# Patient Record
Sex: Male | Born: 1935 | Race: White | Hispanic: No | Marital: Married | Smoking: Never smoker
Health system: Southern US, Community
[De-identification: ages and names within clinical notes are randomized; demographics above are authoritative.]

## PROBLEM LIST (undated history)

## (undated) DIAGNOSIS — E119 Type 2 diabetes mellitus without complications: Secondary | ICD-10-CM

## (undated) DIAGNOSIS — N289 Disorder of kidney and ureter, unspecified: Secondary | ICD-10-CM

## (undated) DIAGNOSIS — M109 Gout, unspecified: Secondary | ICD-10-CM

## (undated) DIAGNOSIS — I10 Essential (primary) hypertension: Secondary | ICD-10-CM

## (undated) DIAGNOSIS — I509 Heart failure, unspecified: Secondary | ICD-10-CM

---

## 2003-09-02 ENCOUNTER — Ambulatory Visit (HOSPITAL_COMMUNITY): Admission: RE | Admit: 2003-09-02 | Discharge: 2003-09-02 | Payer: Self-pay | Admitting: Cardiology

## 2003-09-02 ENCOUNTER — Encounter (INDEPENDENT_AMBULATORY_CARE_PROVIDER_SITE_OTHER): Payer: Self-pay | Admitting: Cardiology

## 2008-09-01 ENCOUNTER — Encounter: Admission: RE | Admit: 2008-09-01 | Discharge: 2008-09-01 | Payer: Self-pay | Admitting: Neurological Surgery

## 2010-07-20 ENCOUNTER — Ambulatory Visit
Admission: RE | Admit: 2010-07-20 | Discharge: 2010-07-20 | Disposition: A | Discharge status: Home / Self Care | Payer: MEDICARE | Source: Ambulatory Visit | Attending: Cardiology | Admitting: Cardiology

## 2010-07-20 ENCOUNTER — Other Ambulatory Visit: Payer: Self-pay | Admitting: Cardiology

## 2010-07-20 DIAGNOSIS — R0989 Other specified symptoms and signs involving the circulatory and respiratory systems: Secondary | ICD-10-CM

## 2010-07-20 DIAGNOSIS — R0609 Other forms of dyspnea: Secondary | ICD-10-CM

## 2010-07-22 ENCOUNTER — Ambulatory Visit (HOSPITAL_COMMUNITY)
Admission: RE | Admit: 2010-07-22 | Discharge: 2010-07-22 | Disposition: A | Discharge status: Home / Self Care | Payer: MEDICARE | Source: Ambulatory Visit | Attending: Cardiology | Admitting: Cardiology

## 2010-07-22 DIAGNOSIS — R0989 Other specified symptoms and signs involving the circulatory and respiratory systems: Secondary | ICD-10-CM | POA: Insufficient documentation

## 2010-07-22 DIAGNOSIS — R0609 Other forms of dyspnea: Secondary | ICD-10-CM | POA: Insufficient documentation

## 2010-07-22 DIAGNOSIS — I059 Rheumatic mitral valve disease, unspecified: Secondary | ICD-10-CM | POA: Insufficient documentation

## 2010-07-22 DIAGNOSIS — I509 Heart failure, unspecified: Secondary | ICD-10-CM | POA: Insufficient documentation

## 2011-07-23 ENCOUNTER — Other Ambulatory Visit: Payer: Self-pay | Admitting: Cardiology

## 2011-08-13 ENCOUNTER — Other Ambulatory Visit: Payer: Self-pay | Admitting: Cardiology

## 2012-08-01 ENCOUNTER — Telehealth: Payer: Self-pay | Admitting: Oncology

## 2012-08-01 NOTE — Telephone Encounter (Signed)
Called pt to to schedule NP appt. NO abel to leave vm due to mailbox is full will try back.

## 2012-08-06 ENCOUNTER — Telehealth: Payer: Self-pay | Admitting: Oncology

## 2012-08-06 NOTE — Telephone Encounter (Signed)
C/D 08/06/12 for appt.08/24/12 °

## 2012-08-06 NOTE — Telephone Encounter (Signed)
S/W pt in re NP appt 4/4 @ 1:30 w/Dr. Clelia Croft.  Referring Dr. Windle Guard Dx- ? Anemia of Chronic Disease, CKD.  Welcome packet mailed.

## 2012-08-09 ENCOUNTER — Encounter (HOSPITAL_COMMUNITY): Payer: Self-pay | Admitting: Emergency Medicine

## 2012-08-09 ENCOUNTER — Emergency Department (HOSPITAL_COMMUNITY): Payer: Medicare Other

## 2012-08-09 ENCOUNTER — Inpatient Hospital Stay (HOSPITAL_COMMUNITY)
Admission: EM | Admit: 2012-08-09 | Discharge: 2012-08-12 | DRG: 553 | Disposition: A | Discharge status: Home / Self Care | Payer: Medicare Other | Attending: Internal Medicine | Admitting: Internal Medicine

## 2012-08-09 DIAGNOSIS — I10 Essential (primary) hypertension: Secondary | ICD-10-CM

## 2012-08-09 DIAGNOSIS — I509 Heart failure, unspecified: Secondary | ICD-10-CM

## 2012-08-09 DIAGNOSIS — N289 Disorder of kidney and ureter, unspecified: Secondary | ICD-10-CM

## 2012-08-09 DIAGNOSIS — G9349 Other encephalopathy: Secondary | ICD-10-CM | POA: Diagnosis present

## 2012-08-09 DIAGNOSIS — N182 Chronic kidney disease, stage 2 (mild): Secondary | ICD-10-CM | POA: Diagnosis present

## 2012-08-09 DIAGNOSIS — G9340 Encephalopathy, unspecified: Secondary | ICD-10-CM

## 2012-08-09 DIAGNOSIS — R509 Fever, unspecified: Secondary | ICD-10-CM

## 2012-08-09 DIAGNOSIS — R6510 Systemic inflammatory response syndrome (SIRS) of non-infectious origin without acute organ dysfunction: Secondary | ICD-10-CM

## 2012-08-09 DIAGNOSIS — N189 Chronic kidney disease, unspecified: Secondary | ICD-10-CM

## 2012-08-09 DIAGNOSIS — E119 Type 2 diabetes mellitus without complications: Secondary | ICD-10-CM

## 2012-08-09 DIAGNOSIS — D638 Anemia in other chronic diseases classified elsewhere: Secondary | ICD-10-CM

## 2012-08-09 DIAGNOSIS — M25539 Pain in unspecified wrist: Secondary | ICD-10-CM | POA: Diagnosis present

## 2012-08-09 DIAGNOSIS — M109 Gout, unspecified: Principal | ICD-10-CM

## 2012-08-09 DIAGNOSIS — D631 Anemia in chronic kidney disease: Secondary | ICD-10-CM | POA: Diagnosis present

## 2012-08-09 DIAGNOSIS — N179 Acute kidney failure, unspecified: Secondary | ICD-10-CM

## 2012-08-09 DIAGNOSIS — I129 Hypertensive chronic kidney disease with stage 1 through stage 4 chronic kidney disease, or unspecified chronic kidney disease: Secondary | ICD-10-CM | POA: Diagnosis present

## 2012-08-09 DIAGNOSIS — I959 Hypotension, unspecified: Secondary | ICD-10-CM

## 2012-08-09 DIAGNOSIS — Z79899 Other long term (current) drug therapy: Secondary | ICD-10-CM

## 2012-08-09 HISTORY — DX: Gout, unspecified: M10.9

## 2012-08-09 HISTORY — DX: Heart failure, unspecified: I50.9

## 2012-08-09 HISTORY — DX: Disorder of kidney and ureter, unspecified: N28.9

## 2012-08-09 HISTORY — DX: Type 2 diabetes mellitus without complications: E11.9

## 2012-08-09 HISTORY — DX: Essential (primary) hypertension: I10

## 2012-08-09 LAB — URINALYSIS, ROUTINE W REFLEX MICROSCOPIC
Bilirubin Urine: NEGATIVE
Glucose, UA: NEGATIVE mg/dL
Ketones, ur: NEGATIVE mg/dL
Leukocytes, UA: NEGATIVE
Nitrite: NEGATIVE
Protein, ur: 30 mg/dL — AB
Specific Gravity, Urine: 1.018 (ref 1.005–1.030)
Urobilinogen, UA: 1 mg/dL (ref 0.0–1.0)
pH: 7.5 (ref 5.0–8.0)

## 2012-08-09 LAB — SEDIMENTATION RATE: Sed Rate: 63 mm/hr — ABNORMAL HIGH (ref 0–16)

## 2012-08-09 LAB — CBC WITH DIFFERENTIAL/PLATELET
Basophils Absolute: 0 10*3/uL (ref 0.0–0.1)
Basophils Relative: 0 % (ref 0–1)
Eosinophils Absolute: 0 10*3/uL (ref 0.0–0.7)
Eosinophils Relative: 0 % (ref 0–5)
HCT: 31.3 % — ABNORMAL LOW (ref 39.0–52.0)
Hemoglobin: 10 g/dL — ABNORMAL LOW (ref 13.0–17.0)
Lymphocytes Relative: 5 % — ABNORMAL LOW (ref 12–46)
Lymphs Abs: 0.9 10*3/uL (ref 0.7–4.0)
MCH: 29.7 pg (ref 26.0–34.0)
MCHC: 31.9 g/dL (ref 30.0–36.0)
MCV: 92.9 fL (ref 78.0–100.0)
Monocytes Absolute: 1.1 10*3/uL — ABNORMAL HIGH (ref 0.1–1.0)
Monocytes Relative: 7 % (ref 3–12)
Neutro Abs: 14.5 10*3/uL — ABNORMAL HIGH (ref 1.7–7.7)
Neutrophils Relative %: 88 % — ABNORMAL HIGH (ref 43–77)
Platelets: 205 10*3/uL (ref 150–400)
RBC: 3.37 MIL/uL — ABNORMAL LOW (ref 4.22–5.81)
RDW: 14.2 % (ref 11.5–15.5)
WBC: 16.5 10*3/uL — ABNORMAL HIGH (ref 4.0–10.5)

## 2012-08-09 LAB — CG4 I-STAT (LACTIC ACID): Lactic Acid, Venous: 2.09 mmol/L (ref 0.5–2.2)

## 2012-08-09 LAB — BASIC METABOLIC PANEL
BUN: 32 mg/dL — ABNORMAL HIGH (ref 6–23)
CO2: 25 mEq/L (ref 19–32)
Calcium: 9 mg/dL (ref 8.4–10.5)
Chloride: 97 mEq/L (ref 96–112)
Creatinine, Ser: 1.76 mg/dL — ABNORMAL HIGH (ref 0.50–1.35)
GFR calc Af Amer: 41 mL/min — ABNORMAL LOW (ref >90)
GFR calc non Af Amer: 36 mL/min — ABNORMAL LOW (ref >90)
Glucose, Bld: 212 mg/dL — ABNORMAL HIGH (ref 70–99)
Potassium: 3.5 mEq/L (ref 3.5–5.1)
Sodium: 135 mEq/L (ref 135–145)

## 2012-08-09 LAB — URINE MICROSCOPIC-ADD ON

## 2012-08-09 LAB — URIC ACID: Uric Acid, Serum: 8 mg/dL — ABNORMAL HIGH (ref 4.0–7.8)

## 2012-08-09 MED ORDER — ACETAMINOPHEN 325 MG PO TABS
650.0000 mg | ORAL_TABLET | Freq: Once | ORAL | Status: AC
  Administered 2012-08-09: 650 mg via ORAL
  Filled 2012-08-09: qty 2

## 2012-08-09 MED ORDER — FENTANYL CITRATE 0.05 MG/ML IJ SOLN
50.0000 ug | Freq: Once | INTRAMUSCULAR | Status: AC
  Administered 2012-08-09: 50 ug via INTRAVENOUS
  Filled 2012-08-09: qty 2

## 2012-08-09 MED ORDER — LIDOCAINE HCL 2 % IJ SOLN
INTRAMUSCULAR | Status: AC
  Administered 2012-08-10
  Filled 2012-08-09: qty 20

## 2012-08-09 MED ORDER — SODIUM CHLORIDE 0.9 % IV BOLUS (SEPSIS)
1000.0000 mL | Freq: Once | INTRAVENOUS | Status: AC
  Administered 2012-08-09: 1000 mL via INTRAVENOUS

## 2012-08-09 MED ORDER — DEXTROSE 5 % IV SOLN
2.0000 g | Freq: Once | INTRAVENOUS | Status: AC
  Administered 2012-08-09: 2 mg via INTRAVENOUS
  Filled 2012-08-09: qty 2

## 2012-08-09 MED ORDER — VANCOMYCIN HCL IN DEXTROSE 1-5 GM/200ML-% IV SOLN
1000.0000 mg | Freq: Once | INTRAVENOUS | Status: AC
  Administered 2012-08-09: 1000 mg via INTRAVENOUS
  Filled 2012-08-09: qty 200

## 2012-08-09 MED ORDER — SODIUM CHLORIDE 0.9 % IV BOLUS (SEPSIS)
2000.0000 mL | Freq: Once | INTRAVENOUS | Status: AC
  Administered 2012-08-09: 2000 mL via INTRAVENOUS

## 2012-08-09 MED ORDER — MORPHINE SULFATE 4 MG/ML IJ SOLN
4.0000 mg | Freq: Once | INTRAMUSCULAR | Status: AC
  Administered 2012-08-09: 4 mg via INTRAVENOUS
  Filled 2012-08-09: qty 1

## 2012-08-09 NOTE — Consult Note (Signed)
Darren Brennan is an 79 y.o. male.   Chief Complaint: left wrist pain HPI: 79 yo rhd male present in ED with wife.  Being admitted for fever.  Had fever to 104.  Now normalized.  Fell ~ 3 weeks ago and has had wrist pain since fall.  History of gout in knees but not in wrist previously.  On Allopurinol.  Wrist feels better now than it did earlier.  Has noted warm feeling in wrist.  Past Medical History  Diagnosis Date  . Hypertension   . Diabetes mellitus without complication   . Gout   . Renal disorder   . CHF (congestive heart failure)     History reviewed. No pertinent past surgical history.  No family history on file. Social History:  reports that he has never smoked. He does not have any smokeless tobacco history on file. He reports that he does not drink alcohol or use illicit drugs.  Allergies: No Known Allergies   (Not in a hospital admission)  Results for orders placed during the hospital encounter of 08/09/12 (from the past 48 hour(s))  URINALYSIS, ROUTINE W REFLEX MICROSCOPIC     Status: Abnormal   Collection Time    08/09/12  3:15 PM      Result Value Range   Color, Urine YELLOW  YELLOW   APPearance CLEAR  CLEAR   Specific Gravity, Urine 1.018  1.005 - 1.030   pH 7.5  5.0 - 8.0   Glucose, UA NEGATIVE  NEGATIVE mg/dL   Hgb urine dipstick LARGE (*) NEGATIVE   Bilirubin Urine NEGATIVE  NEGATIVE   Ketones, ur NEGATIVE  NEGATIVE mg/dL   Protein, ur 30 (*) NEGATIVE mg/dL   Urobilinogen, UA 1.0  0.0 - 1.0 mg/dL   Nitrite NEGATIVE  NEGATIVE   Leukocytes, UA NEGATIVE  NEGATIVE  URINE MICROSCOPIC-ADD ON     Status: Abnormal   Collection Time    08/09/12  3:15 PM      Result Value Range   Squamous Epithelial / LPF FEW (*) RARE   RBC / HPF 3-6  <3 RBC/hpf   Bacteria, UA FEW (*) RARE   Urine-Other MUCOUS PRESENT    CBC WITH DIFFERENTIAL     Status: Abnormal   Collection Time    08/09/12  3:45 PM      Result Value Range   WBC 16.5 (*) 4.0 - 10.5 K/uL   RBC 3.37  (*) 4.22 - 5.81 MIL/uL   Hemoglobin 10.0 (*) 13.0 - 17.0 g/dL   HCT 95.2 (*) 84.1 - 32.4 %   MCV 92.9  78.0 - 100.0 fL   MCH 29.7  26.0 - 34.0 pg   MCHC 31.9  30.0 - 36.0 g/dL   RDW 40.1  02.7 - 25.3 %   Platelets 205  150 - 400 K/uL   Neutrophils Relative 88 (*) 43 - 77 %   Neutro Abs 14.5 (*) 1.7 - 7.7 K/uL   Lymphocytes Relative 5 (*) 12 - 46 %   Lymphs Abs 0.9  0.7 - 4.0 K/uL   Monocytes Relative 7  3 - 12 %   Monocytes Absolute 1.1 (*) 0.1 - 1.0 K/uL   Eosinophils Relative 0  0 - 5 %   Eosinophils Absolute 0.0  0.0 - 0.7 K/uL   Basophils Relative 0  0 - 1 %   Basophils Absolute 0.0  0.0 - 0.1 K/uL  BASIC METABOLIC PANEL     Status: Abnormal   Collection Time  08/09/12  3:45 PM      Result Value Range   Sodium 135  135 - 145 mEq/L   Potassium 3.5  3.5 - 5.1 mEq/L   Chloride 97  96 - 112 mEq/L   CO2 25  19 - 32 mEq/L   Glucose, Bld 212 (*) 70 - 99 mg/dL   BUN 32 (*) 6 - 23 mg/dL   Creatinine, Ser 7.82 (*) 0.50 - 1.35 mg/dL   Calcium 9.0  8.4 - 95.6 mg/dL   GFR calc non Af Amer 36 (*) >90 mL/min   GFR calc Af Amer 41 (*) >90 mL/min   Comment:            The eGFR has been calculated     using the CKD EPI equation.     This calculation has not been     validated in all clinical     situations.     eGFR's persistently     <90 mL/min signify     possible Chronic Kidney Disease.  URIC ACID     Status: Abnormal   Collection Time    08/09/12  3:45 PM      Result Value Range   Uric Acid, Serum 8.0 (*) 4.0 - 7.8 mg/dL  CG4 I-STAT (LACTIC ACID)     Status: None   Collection Time    08/09/12  3:56 PM      Result Value Range   Lactic Acid, Venous 2.09  0.5 - 2.2 mmol/L  SEDIMENTATION RATE     Status: Abnormal   Collection Time    08/09/12  8:06 PM      Result Value Range   Sed Rate 63 (*) 0 - 16 mm/hr    Dg Chest 2 View  08/09/2012  *RADIOLOGY REPORT*  Clinical Data: Fever.  Hypertension.  Diabetes.  Gout.  CHEST - 2 VIEW  Comparison: 07/20/2010  Findings:  Superiorly subluxed humeral head on the right favors chronic rotator cuff tear.  Cardiac and mediastinal contours appear unremarkable.  Minimal subsegmental atelectasis noted along the left hemidiaphragm.  Thoracic spondylosis is present.  IMPRESSION:  1.  Minimal left basilar subsegmental atelectasis. 2.  Suspected chronic rotator cuff tear on the right.   Original Report Authenticated By: Gaylyn Rong, M.D.    Dg Wrist Complete Left  08/09/2012  *RADIOLOGY REPORT*  Clinical Data: Fall.  Left wrist pain and swelling.  LEFT WRIST - COMPLETE 3+ VIEW  Comparison: None.  Findings: The carpals are located.  No acute or healing fracture is identified.  There is moderate osteoarthritis of the first carpometacarpal joint. No acute or healing fracture is identified. There is diffuse soft tissue swelling about the wrist.  IMPRESSION:  1.  Diffuse soft tissue swelling about the wrist is seen.  No acute fracture is identified. 2.  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   Original Report Authenticated By: Britta Mccreedy, M.D.      A comprehensive review of systems was negative except for: Constitutional: positive for fevers  Blood pressure 116/59, pulse 83, temperature 98.2 F (36.8 C), temperature source Oral, resp. rate 14, SpO2 100.00%.  General appearance: alert, cooperative and appears stated age Head: Normocephalic, without obvious abnormality, atraumatic Neck: supple, symmetrical, trachea midline Extremities: intact sensation and capillary refill all digits.  +epl/fpl/io.  right ue: no wounds or ttp.  left ue: no wounds.  mildly tender at dorsum of wrist.  some swelling.  minimal erythema.  no proximal streaking.  able to  perform a/prom in a limited arc without pain.  not warm compared to opposite side.  no wounds or other ttp. Pulses: 2+ and symmetric Skin: as above Neurologic: Grossly normal Incision/Wound: na  Assessment/Plan Left wrist pain.  Gout vs septic arthritis vs  osteoarthritis exacerbation.  Attempted aspiration of wrist with no fluid obtained.  I feel that this is unlikely to be septic arthritis with no fluid on aspiration, some motion without pain, and minimal erythema.  Recommend antiinflammtories vs colcrys for possible gout or OA exacerbation.  Will follow.  Splint for comfort.  Procedure: Informed consent with wife and patient regarding risks, benefits, alternatives of aspiration of wrist.  Dorsum of left wrist prepped with betadine.  Skin anesthetized with 2 cc 2% plain lidocaine.  Skin prepped with betadine and draped with sterile towels.  Aspiration of wrist joint with no fluid obtained to send for analylsis.  Aspiration site dressed with bandaid.  Tolerated procedure well.  Joshalyn Ancheta R 08/09/2012, 11:58 PM

## 2012-08-09 NOTE — ED Provider Notes (Signed)
History    79yM with fever and AMS. Gradual onset about 2d ago. Progressively worsening. Pt states that he just feels weak. Denies any acute pain anywhere. No sob. No urinary complaints. Nausea, but no vomiting. No diarrhea. No rash. No sick contacts.   CSN: [id number]  Arrival date & time 08/09/12  1453   First MD Initiated Contact with Patient 08/09/12 1517      Chief Complaint  Patient presents with  . Fever  . Weakness    (Consider location/radiation/quality/duration/timing/severity/associated sxs/prior treatment) HPI  Past Medical History  Diagnosis Date  . Hypertension   . Diabetes mellitus without complication   . Gout   . Renal disorder   . CHF (congestive heart failure)     History reviewed. No pertinent past surgical history.  No family history on file.  History  Substance Use Topics  . Smoking status: Never Smoker   . Smokeless tobacco: Not on file  . Alcohol Use: No      Review of Systems  All systems reviewed and negative, other than as noted in HPI.   Allergies  Review of patient's allergies indicates no known allergies.  Home Medications   Current Outpatient Rx  Name  Route  Sig  Dispense  Refill  . allopurinol (ZYLOPRIM) 100 MG tablet   Oral   Take 200 mg by mouth daily.         Marland Kitchen HYDROcodone-acetaminophen (NORCO/VICODIN) 5-325 MG per tablet   Oral   Take 1 tablet by mouth every 6 (six) hours as needed (pain).         Marland Kitchen lisinopril (PRINIVIL,ZESTRIL) 40 MG tablet   Oral   Take 40 mg by mouth daily.         . metFORMIN (GLUCOPHAGE) 1000 MG tablet   Oral   Take 500 mg by mouth 2 (two) times daily with a meal.         . metoprolol (LOPRESSOR) 50 MG tablet   Oral   Take 25 mg by mouth daily.         . predniSONE (DELTASONE) 20 MG tablet   Oral   Take 20 mg by mouth daily.         . tamsulosin (FLOMAX) 0.4 MG CAPS   Oral   Take 0.4 mg by mouth daily.           BP 104/55  Pulse 97  Temp(Src) 101.9 F (38.8 C)  (Rectal)  Resp 20  SpO2 96%  Physical Exam  Nursing note and vitals reviewed. Constitutional: He appears well-developed and well-nourished. No distress.  HENT:  Head: Normocephalic and atraumatic.  Eyes: Conjunctivae are normal. Right eye exhibits no discharge. Left eye exhibits no discharge.  Neck: Neck supple.  No nuchal rigidity  Cardiovascular: Regular rhythm and normal heart sounds.  Exam reveals no gallop and no friction rub.   No murmur heard. tachycardic  Pulmonary/Chest: Effort normal and breath sounds normal. No respiratory distress.  Abdominal: Soft. He exhibits no distension. There is no tenderness.  Musculoskeletal: He exhibits no edema and no tenderness.  L wrist swollen, warm to touch and faint erythema. Significant pain with both passive and active ROM. NVI distally. Area that wife reports had abrasion in now well healed. No concerning skin lesions noted.   Neurological: He is alert. No cranial nerve deficit. He exhibits normal muscle tone. Coordination normal.  Disoriented to time. Repetitive questioning.   Skin: Skin is warm and dry.  Psychiatric: He has a normal mood  and affect. His behavior is normal. Thought content normal.    ED Course  LUMBAR PUNCTURE Date/Time: 08/09/2012 11:42 PM Performed by: Raeford Razor Authorized by: Raeford Razor Consent: Verbal consent obtained. Risks and benefits: risks, benefits and alternatives were discussed Consent given by: patient and spouse Patient identity confirmed: verbally with patient, arm band and provided demographic data Indications: evaluation for infection Anesthesia: local infiltration Local anesthetic: lidocaine 1% without epinephrine Anesthetic total: 2 ml Patient sedated: no Preparation: Patient was prepped and draped in the usual sterile fashion. Lumbar space: L4-L5 interspace Patient's position: right lateral decubitus Needle gauge: 20 Needle type: spinal needle - Quincke tip Needle length: 3.5  in Number of attempts: 1 Comments: Pt just wouldn't tolerate procedure. Kept trying to look over shoulder at what I was doing. Did make an attempt with trying to talk pt through procedure but quickly aborted when obvious that wasn't going to be able to hold still for it.    (including critical care time)  Labs Reviewed  URINALYSIS, ROUTINE W REFLEX MICROSCOPIC - Abnormal; Notable for the following:    Hgb urine dipstick LARGE (*)    Protein, ur 30 (*)    All other components within normal limits  CBC WITH DIFFERENTIAL - Abnormal; Notable for the following:    WBC 16.5 (*)    RBC 3.37 (*)    Hemoglobin 10.0 (*)    HCT 31.3 (*)    Neutrophils Relative 88 (*)    Neutro Abs 14.5 (*)    Lymphocytes Relative 5 (*)    Monocytes Absolute 1.1 (*)    All other components within normal limits  BASIC METABOLIC PANEL - Abnormal; Notable for the following:    Glucose, Bld 212 (*)    BUN 32 (*)    Creatinine, Ser 1.76 (*)    GFR calc non Af Amer 36 (*)    GFR calc Af Amer 41 (*)    All other components within normal limits  URINE MICROSCOPIC-ADD ON - Abnormal; Notable for the following:    Squamous Epithelial / LPF FEW (*)    Bacteria, UA FEW (*)    All other components within normal limits  SEDIMENTATION RATE - Abnormal; Notable for the following:    Sed Rate 63 (*)    All other components within normal limits  URIC ACID - Abnormal; Notable for the following:    Uric Acid, Serum 8.0 (*)    All other components within normal limits  CULTURE, BLOOD (ROUTINE X 2)  CULTURE, BLOOD (ROUTINE X 2)  C-REACTIVE PROTEIN  CG4 I-STAT (LACTIC ACID)   Dg Chest 2 View  08/09/2012  *RADIOLOGY REPORT*  Clinical Data: Fever.  Hypertension.  Diabetes.  Gout.  CHEST - 2 VIEW  Comparison: 07/20/2010  Findings: Superiorly subluxed humeral head on the right favors chronic rotator cuff tear.  Cardiac and mediastinal contours appear unremarkable.  Minimal subsegmental atelectasis noted along the left  hemidiaphragm.  Thoracic spondylosis is present.  IMPRESSION:  1.  Minimal left basilar subsegmental atelectasis. 2.  Suspected chronic rotator cuff tear on the right.   Original Report Authenticated By: Gaylyn Rong, M.D.      1. Fever   2. Encephalopathy acute   3. Renal insufficiency       MDM  79yM with fever and AMS. Plan septic w/u.    6:56 PM Pt with easily identifiable landmarks, but just would not tolerate LP. Pt with transient hypotension to 70/40s and hesitant to sedate. Is responding to  additional IVF though. Is being covered empirically at this point. My suspicion for meningitis is relatively low to begin with but proceeded because of AMS and didn't have obvious source. Suspect encephalopathy is related to infectious, but not specifically meningitis/encephalitis.  Possible source is L wrist which is warm, painful and swollen.  Did not initially c/o pain and not specifically examined on first exam. Pointed out by wife once she arried. Wife reports trauma when slipped in basement on March 1. C/o wrist pain since. Sustained a small abrasion to dorsal aspect of proximal hand which has since healed. PCP has been tx'ing for possible gout.   Pt evaluated by Dr Merlyn Lot, hand surgery. Attempted aspiration of wrist but no large collection and suspicion is that not septic joint.   Raeford Razor, MD 08/10/12 [phone]

## 2012-08-09 NOTE — ED Notes (Addendum)
Pt arrives from home by [facility] with c/o fever, fatigue and weakness x's 2 days. Pt has hx of CHF, HTN, Gout. 18G IV RAC.EMs reports that pts urine has been dark.

## 2012-08-09 NOTE — ED Notes (Signed)
Pt is NPO.

## 2012-08-10 ENCOUNTER — Inpatient Hospital Stay (HOSPITAL_COMMUNITY): Payer: Medicare Other

## 2012-08-10 ENCOUNTER — Encounter (HOSPITAL_COMMUNITY): Payer: Self-pay | Admitting: Radiology

## 2012-08-10 DIAGNOSIS — I10 Essential (primary) hypertension: Secondary | ICD-10-CM | POA: Insufficient documentation

## 2012-08-10 DIAGNOSIS — M109 Gout, unspecified: Secondary | ICD-10-CM | POA: Diagnosis present

## 2012-08-10 DIAGNOSIS — R509 Fever, unspecified: Secondary | ICD-10-CM | POA: Diagnosis present

## 2012-08-10 DIAGNOSIS — I959 Hypotension, unspecified: Secondary | ICD-10-CM

## 2012-08-10 DIAGNOSIS — E119 Type 2 diabetes mellitus without complications: Secondary | ICD-10-CM

## 2012-08-10 DIAGNOSIS — I509 Heart failure, unspecified: Secondary | ICD-10-CM | POA: Diagnosis present

## 2012-08-10 DIAGNOSIS — R6510 Systemic inflammatory response syndrome (SIRS) of non-infectious origin without acute organ dysfunction: Secondary | ICD-10-CM

## 2012-08-10 DIAGNOSIS — N289 Disorder of kidney and ureter, unspecified: Secondary | ICD-10-CM

## 2012-08-10 LAB — BASIC METABOLIC PANEL
BUN: 29 mg/dL — ABNORMAL HIGH (ref 6–23)
Calcium: 7.8 mg/dL — ABNORMAL LOW (ref 8.4–10.5)
GFR calc Af Amer: 48 mL/min — ABNORMAL LOW (ref >90)
GFR calc non Af Amer: 41 mL/min — ABNORMAL LOW (ref >90)
Glucose, Bld: 131 mg/dL — ABNORMAL HIGH (ref 70–99)

## 2012-08-10 LAB — GRAM STAIN: Gram Stain: NONE SEEN

## 2012-08-10 LAB — CBC
HCT: 26 % — ABNORMAL LOW (ref 39.0–52.0)
Hemoglobin: 8.3 g/dL — ABNORMAL LOW (ref 13.0–17.0)
MCH: 29.6 pg (ref 26.0–34.0)
MCHC: 31.9 g/dL (ref 30.0–36.0)

## 2012-08-10 LAB — CSF CELL COUNT WITH DIFFERENTIAL: WBC, CSF: 0 /mm3 (ref 0–5)

## 2012-08-10 LAB — GLUCOSE, CAPILLARY
Glucose-Capillary: 117 mg/dL — ABNORMAL HIGH (ref 70–99)
Glucose-Capillary: 241 mg/dL — ABNORMAL HIGH (ref 70–99)

## 2012-08-10 LAB — C-REACTIVE PROTEIN: CRP: 15.1 mg/dL — ABNORMAL HIGH (ref <0.60)

## 2012-08-10 MED ORDER — ACETAMINOPHEN 650 MG RE SUPP: 650.0000 mg | Freq: Four times a day (QID) | RECTAL | Status: DC | PRN

## 2012-08-10 MED ORDER — POTASSIUM CHLORIDE CRYS ER 20 MEQ PO TBCR
40.0000 meq | EXTENDED_RELEASE_TABLET | Freq: Once | ORAL | Status: AC
  Administered 2012-08-10: 40 meq via ORAL
  Filled 2012-08-10: qty 2

## 2012-08-10 MED ORDER — INSULIN GLARGINE 100 UNIT/ML ~~LOC~~ SOLN
10.0000 [IU] | Freq: Every day | SUBCUTANEOUS | Status: DC
  Administered 2012-08-10 – 2012-08-12 (×3): 10 [IU] via SUBCUTANEOUS
  Filled 2012-08-10 (×3): qty 0.1

## 2012-08-10 MED ORDER — ONDANSETRON HCL 4 MG PO TABS: 4.0000 mg | ORAL_TABLET | Freq: Four times a day (QID) | ORAL | Status: DC | PRN

## 2012-08-10 MED ORDER — TAMSULOSIN HCL 0.4 MG PO CAPS
0.4000 mg | ORAL_CAPSULE | Freq: Every day | ORAL | Status: DC
  Administered 2012-08-10 – 2012-08-12 (×3): 0.4 mg via ORAL
  Filled 2012-08-10 (×3): qty 1

## 2012-08-10 MED ORDER — VANCOMYCIN HCL IN DEXTROSE 1-5 GM/200ML-% IV SOLN
1000.0000 mg | INTRAVENOUS | Status: DC
Start: 2012-08-10 — End: 2012-08-10
  Filled 2012-08-10: qty 200

## 2012-08-10 MED ORDER — VANCOMYCIN HCL 1000 MG IV SOLR
750.0000 mg | Freq: Two times a day (BID) | INTRAVENOUS | Status: DC
  Administered 2012-08-10 – 2012-08-11 (×4): 750 mg via INTRAVENOUS
  Filled 2012-08-10 (×5): qty 750

## 2012-08-10 MED ORDER — DEXTROSE 5 % IV SOLN
2.0000 g | Freq: Two times a day (BID) | INTRAVENOUS | Status: DC
  Administered 2012-08-10 – 2012-08-12 (×5): 2 g via INTRAVENOUS
  Filled 2012-08-10 (×5): qty 2

## 2012-08-10 MED ORDER — ALLOPURINOL 100 MG PO TABS
200.0000 mg | ORAL_TABLET | Freq: Every day | ORAL | Status: DC
  Administered 2012-08-10 – 2012-08-12 (×3): 200 mg via ORAL
  Filled 2012-08-10 (×3): qty 2

## 2012-08-10 MED ORDER — ONDANSETRON HCL 4 MG/2ML IJ SOLN: 4.0000 mg | Freq: Four times a day (QID) | INTRAMUSCULAR | Status: DC | PRN

## 2012-08-10 MED ORDER — ACETAMINOPHEN 325 MG PO TABS
650.0000 mg | ORAL_TABLET | Freq: Four times a day (QID) | ORAL | Status: DC | PRN
  Administered 2012-08-10 – 2012-08-12 (×3): 650 mg via ORAL
  Filled 2012-08-10 (×3): qty 2

## 2012-08-10 MED ORDER — HYDROCORTISONE SOD SUCCINATE 100 MG IJ SOLR
50.0000 mg | Freq: Three times a day (TID) | INTRAMUSCULAR | Status: DC
  Administered 2012-08-10 – 2012-08-11 (×6): 50 mg via INTRAVENOUS
  Filled 2012-08-10 (×7): qty 1

## 2012-08-10 MED ORDER — INSULIN ASPART 100 UNIT/ML ~~LOC~~ SOLN
0.0000 [IU] | Freq: Three times a day (TID) | SUBCUTANEOUS | Status: DC
  Administered 2012-08-10 (×2): 3 [IU] via SUBCUTANEOUS
  Administered 2012-08-10 – 2012-08-11 (×2): 1 [IU] via SUBCUTANEOUS
  Administered 2012-08-11: 3 [IU] via SUBCUTANEOUS
  Administered 2012-08-11: 2 [IU] via SUBCUTANEOUS
  Administered 2012-08-12: 1 [IU] via SUBCUTANEOUS

## 2012-08-10 MED ORDER — ENOXAPARIN SODIUM 40 MG/0.4ML ~~LOC~~ SOLN
40.0000 mg | Freq: Every day | SUBCUTANEOUS | Status: DC
  Administered 2012-08-11 – 2012-08-12 (×2): 40 mg via SUBCUTANEOUS
  Filled 2012-08-10 (×3): qty 0.4

## 2012-08-10 MED ORDER — SODIUM CHLORIDE 0.9 % IV SOLN: INTRAVENOUS | Status: DC

## 2012-08-10 MED ORDER — ENOXAPARIN SODIUM 30 MG/0.3ML ~~LOC~~ SOLN
30.0000 mg | Freq: Every day | SUBCUTANEOUS | Status: DC
  Filled 2012-08-10: qty 0.3

## 2012-08-10 MED ORDER — SODIUM CHLORIDE 0.9 % IV SOLN
2.0000 g | Freq: Four times a day (QID) | INTRAVENOUS | Status: DC
  Administered 2012-08-10 – 2012-08-12 (×8): 2 g via INTRAVENOUS
  Filled 2012-08-10 (×9): qty 2000

## 2012-08-10 MED ORDER — SODIUM CHLORIDE 0.9 % IJ SOLN
3.0000 mL | Freq: Two times a day (BID) | INTRAMUSCULAR | Status: DC
  Administered 2012-08-10: 3 mL via INTRAVENOUS

## 2012-08-10 MED ORDER — SODIUM CHLORIDE 0.9 % IV SOLN
INTRAVENOUS | Status: DC
  Administered 2012-08-10: 100 mL/h via INTRAVENOUS
  Administered 2012-08-10: 02:00:00 via INTRAVENOUS
  Administered 2012-08-10: 100 mL/h via INTRAVENOUS
  Administered 2012-08-11 (×2): via INTRAVENOUS

## 2012-08-10 MED ORDER — INSULIN ASPART 100 UNIT/ML ~~LOC~~ SOLN
0.0000 [IU] | Freq: Every day | SUBCUTANEOUS | Status: DC
  Administered 2012-08-10 – 2012-08-11 (×2): 2 [IU] via SUBCUTANEOUS

## 2012-08-10 NOTE — Progress Notes (Signed)
Subjective: Left wrist feels much better today.  No complaints regarding wrist.   Objective: Vital signs in last 24 hours: Temp:  [97.2 F (36.2 C)-101.9 F (38.8 C)] 97.2 F (36.2 C) (03/21 1337) Pulse Rate:  [35-101] 80 (03/21 1337) Resp:  [11-22] 20 (03/21 1337) BP: (70-125)/(41-73) 112/57 mmHg (03/21 1337) SpO2:  [93 %-100 %] 100 % (03/21 1337) Weight:  [74.6 kg (164 lb 7.4 oz)] 74.6 kg (164 lb 7.4 oz) (03/21 0245)  Intake/Output from previous day: 03/20 0701 - 03/21 0700 In: 661.7 [P.O.:240; I.V.:421.7] Out: 425 [Urine:425] Intake/Output this shift: Total I/O In: 600 [P.O.:600] Out: 625 [Urine:625]   Recent Labs  08/09/12 1545 08/10/12 0440  HGB 10.0* 8.3*    Recent Labs  08/09/12 1545 08/10/12 0440  WBC 16.5* 11.9*  RBC 3.37* 2.80*  HCT 31.3* 26.0*  PLT 205 163    Recent Labs  08/09/12 1545 08/10/12 0435  NA 135 138  K 3.5 3.1*  CL 97 104  CO2 25 27  BUN 32* 29*  CREATININE 1.76* 1.55*  GLUCOSE 212* 131*  CALCIUM 9.0 7.8*   No results found for this basename: LABPT, INR,  in the last 72 hours  intact sensation and capillary refill all digits.  +epl/fpl/io.  can make full fist.  wrist range of motion significantly improved from last night.  no pain with a/prom.  no erythema, minimal swelling.  compartments soft.  Assessment/Plan: Left wrist pain.  Favor gout as etiology at this point.  No surgical indication at this time regarding wrist.  Will follow.   Darren Brennan R 08/10/2012, 4:59 PM

## 2012-08-10 NOTE — Care Management Note (Signed)
    Page 1 of 1   08/10/2012     2:15:40 PM   CARE MANAGEMENT NOTE 08/10/2012  Patient:  Darren Brennan, Darren Brennan   Account Number:  [id number]  Date Initiated:  08/10/2012  Documentation initiated by:  Lanier Clam  Subjective/Objective Assessment:   ADMITTED W/FEVER,WEAKNESS     Action/Plan:   FROM HOME.HAS PCP,PHARMACY.   Anticipated DC Date:  08/14/2012   Anticipated DC Plan:  HOME/SELF CARE      DC Planning Services  CM consult      Choice offered to / List presented to:             Status of service:  In process, will continue to follow Medicare Important Message given?   (If response is "NO", the following Medicare IM given date fields will be blank) Date Medicare IM given:   Date Additional Medicare IM given:    Discharge Disposition:    Per UR Regulation:  Reviewed for med. necessity/level of care/duration of stay  If discussed at Long Length of Stay Meetings, dates discussed:    Comments:  08/10/12 [facility] RN,BSN NCM [phone]

## 2012-08-10 NOTE — Progress Notes (Signed)
ANTIBIOTIC CONSULT NOTE - INITIAL  Pharmacy Consult for vancomycin ceftriaxone Indication: meningitis  No Known Allergies  Patient Measurements: Height: 5\' 11"  (180.3 cm) Weight: 164 lb 7.4 oz (74.6 kg) IBW/kg (Calculated) : 75.3 Adjusted Body Weight:   Vital Signs: Temp: 98 F (36.7 C) (03/21 0245) Temp src: Oral (03/21 0245) BP: 125/65 mmHg (03/21 0245) Pulse Rate: 78 (03/21 0245) Intake/Output from previous day: 03/20 0701 - 03/21 0700 In: -  Out: 125 [Urine:125] Intake/Output from this shift:    Labs:  Recent Labs  08/09/12 1545  WBC 16.5*  HGB 10.0*  PLT 205  CREATININE 1.76*   Estimated Creatinine Clearance: 37.1 ml/min (by C-G formula based on Cr of 1.76). No results found for this basename: VANCOTROUGH, VANCOPEAK, VANCORANDOM, GENTTROUGH, GENTPEAK, GENTRANDOM, TOBRATROUGH, TOBRAPEAK, TOBRARND, AMIKACINPEAK, AMIKACINTROU, AMIKACIN,  in the last 72 hours   Microbiology: No results found for this or any previous visit (from the past 720 hour(s)).  Medical History: Past Medical History  Diagnosis Date  . Hypertension   . Diabetes mellitus without complication   . Gout   . Renal disorder   . CHF (congestive heart failure)     Medications:  Anti-infectives   Start     Dose/Rate Route Frequency Ordered Stop   08/10/12 1200  vancomycin (VANCOCIN) IVPB 1000 mg/200 mL premix     1,000 mg 200 mL/hr over 60 Minutes Intravenous Every 24 hours 08/10/12 0314     08/10/12 0600  cefTRIAXone (ROCEPHIN) 2 g in dextrose 5 % 50 mL IVPB     2 g 100 mL/hr over 30 Minutes Intravenous Every 12 hours 08/10/12 0314     08/09/12 1730  cefTRIAXone (ROCEPHIN) 2 g in dextrose 5 % 50 mL IVPB     2 g 100 mL/hr over 30 Minutes Intravenous  Once 08/09/12 1657 08/09/12 1754   08/09/12 1730  vancomycin (VANCOCIN) IVPB 1000 mg/200 mL premix     1,000 mg 200 mL/hr over 60 Minutes Intravenous  Once 08/09/12 1657 08/09/12 2101     Assessment: Patient with possible meningitis.   First dose of antibiotics already given in ED.  Goal of Therapy:  Vancomycin trough level 15-20 mcg/ml Ceftriaxone based on dosing guide   Plan:  Measure antibiotic drug levels at steady state Follow up culture results Vancomycin 1gm iv q24hr, ceftriaxone 2gm iv q12hr  Aleene Davidson Crowford 08/10/2012,3:16 AM

## 2012-08-10 NOTE — ED Notes (Signed)
Pt was sinus tach on monitor.

## 2012-08-10 NOTE — Progress Notes (Signed)
ANTIBIOTIC CONSULT NOTE - FOLLOW UP  Pharmacy Consult for Vancomycin, Rocephin Indication: r/o meningitis  No Known Allergies  Patient Measurements: Height: 5\' 11"  (180.3 cm) Weight: 164 lb 7.4 oz (74.6 kg) IBW/kg (Calculated) : 75.3  Vital Signs: Temp: 98 F (36.7 C) (03/21 0535) Temp src: Oral (03/21 0535) BP: 102/60 mmHg (03/21 0535) Pulse Rate: 78 (03/21 0535) Intake/Output from previous day: 03/20 0701 - 03/21 0700 In: 661.7 [P.O.:240; I.V.:421.7] Out: 425 [Urine:425] Intake/Output from this shift: Total I/O In: 360 [P.O.:360] Out: 300 [Urine:300]  Labs:  Recent Labs  08/09/12 1545 08/10/12 0435  WBC 16.5*  --   HGB 10.0*  --   PLT 205  --   CREATININE 1.76* 1.55*   Estimated Creatinine Clearance: 42.1 ml/min (by C-G formula based on Cr of 1.55). No results found for this basename: VANCOTROUGH, Leodis Binet, VANCORANDOM, GENTTROUGH, GENTPEAK, GENTRANDOM, TOBRATROUGH, TOBRAPEAK, TOBRARND, AMIKACINPEAK, AMIKACINTROU, AMIKACIN,  in the last 72 hours    Assessment: 79 yom presented 3/20 with c/o fever, weakness x 2d. UA and blood culture in process. Tmax in ED 104.4.  Vanc and Rocephin x 1 ordered. Pt became hypotensive, with decreased mental status so MD continuing abx per pharmacy for r/o meningitis although suspicion is relatively low. LP attempted in the ED but unsuccessful. Of note, patient had wrist pain s/p fall 3 weeks ago.  Gout vs septic arthritis vs osteoarthritis exacerbation so ortho attempted aspiration but no fluid obtained.   Today is D2 of Vancomycin and Rocephin.  Tmax yesterday 104.4, afebrile since.  WBC 16.5K, Scr improving to 1.55 for CG CrCl of 42 ml/min and normalized CrCl of 41 ml/min.   Blood and urine cultures in process.  Influenza panel pending.   GIven age of > 24 yo, Listeria monocytogenes is a common organism causing meningitis - currently not covered by Vanc and Rocephin.    Goal of Therapy:  Vancomycin trough level 15-20  mcg/ml Appropriate renal dosing of Rocephin  Plan: .  Change Vancomycin to 750 mg IV q12h.  Pharmacy will obtain Vancomycin trough at steady state.   Continue Rocephin 2gm IV q12h   MD, Given age of > 30, would want to cover for Listeria . Consider adding ampicillin 2gm IV q6h   Geoffry Paradise, PharmD, BCPS Pager: [phone] 9:02 AM Pharmacy #: [id number]

## 2012-08-10 NOTE — Procedures (Signed)
*  RADIOLOGY REPORT* Clinical Data: [Fever.  Altered mental status.] LUMBAR PUNCTURE FLUORO GUIDE Fluoroscopy time:  0.5 minutes Procedure:  I discussed the risks (including hemorrhage and infection, among others), benefits, and alternatives to fluoroscopically guided lumbar puncture with the patient's wife by telephone.  The patient was not able to consent due to altered mental status.  We discussed the high likelihood of technical success of the procedure.  She understood and elected for the patient undergo the procedure. Mr. Metoyer was not agreeable to the procedure unless he had something to eat first.  I allowed him to have several crackers and a small glass of water prior to the procedure, balancing aspiration risk with cooperation issues. Standard time-out procedure was employed.  Following sterile skin prep and local anesthetic administration consisting of 1% lidocaine, and under fluoroscopic guidance, a 22 gauge spinal needle was advanced without difficulty into the thecal sac at the L3-4 level.  Clear CSF was returned.  Due to cooperation issues, I was reluctant to turn the patient decubitus for a pressure measurement.  Accordingly, pressure measurement was obtained with the patient in the prone position, measuring 17 cm of water. A total of 12 ml of clear CSF was collected and distributed equally in four vials.  The needle was subsequently removed and the skin cleansed and bandaged.  No immediate complications were observed. IMPRESSION:   [ 1.  Technically successful fluoroscopically guided lumbar puncture at L3-4.]

## 2012-08-10 NOTE — Progress Notes (Signed)
Please see earlier admission note by Dr. Donna Bernard. This is the addendum to the admission note. I have seen and examined the pt at bedside and have reviewed available blood work and vital signs. Patient is 79 year old male with very complex medical history including diabetes, hypertension, gout, chronic renal failure, CHF. He came to emergency department for further evaluation of one week duration of confusion and fevers. In emergency department patient was found to be febrile with temperature of 104 Fahrenheit and blood pressure of 70/40. He was started on IV fluids normal saline as well as empiric vancomycin and Rocephin and has initially responded well with increase in blood pressure to 125/45. Presumptive diagnosis of questionable meningitis versus encephalitis made. LP was ordered, patient currently refusing. I agree with empiric antibiotics mentioned above and will add ampicillin 2 g IV every 6 hours to cover for listeria. Will discuss again with patient importance of LP for further evaluation. In terms of hypotension this is now improved. CBC and BMP in the morning.  Debbora Presto, MD  [facility] Pager [phone]  If 7PM-7AM, please contact night-coverage [email or web address] Password [email or web address]

## 2012-08-10 NOTE — Evaluation (Signed)
Physical Therapy Evaluation Patient Details Name: Darren Brennan MRN: [id number] DOB: 11-20-1932 Today's Date: 08/10/2012 Time: 4098-1191 PT Time Calculation (min): 18 min  PT Assessment / Plan / Recommendation Clinical Impression  Pt admitted for fever and weakness.  MD in to let pt know of LP later today.  Pt would benefit from acute PT services in order to improve independence with transfers and ambulation by increasing strength and activity tolerance to prepare for d/c.    PT Assessment  Patient needs continued PT services    Follow Up Recommendations  Supervision/Assistance - 24 hour;SNF    Does the patient have the potential to tolerate intense rehabilitation      Barriers to Discharge        Equipment Recommendations  Rolling walker with 5" wheels (possibly RW with platform?)    Recommendations for Other Services     Frequency Min 3X/week    Precautions / Restrictions Precautions Precautions: Fall   Pertinent Vitals/Pain Pt reports L wrist pain however better today.      Mobility  Bed Mobility Bed Mobility: Rolling Right;Supine to Sit Rolling Right: 4: Min assist;With rail Supine to Sit: 4: Min assist;With rails Transfers Transfers: Stand to Sit;Sit to Stand Sit to Stand: 1: +2 Total assist Sit to Stand: Patient Percentage: 70% Stand to Sit: 1: +2 Total assist Stand to Sit: Patient Percentage: 70% Details for Transfer Assistance: Pt not using Brennan and holding Darren Brennan up.  Pt states arm better than yesterday- but grip much weaker, assist required to stand and control descent, wide BOS observed upon standing Ambulation/Gait Ambulation/Gait Assistance: 1: +2 Total assist Ambulation/Gait: Patient Percentage: 70% Ambulation Distance (Feet): 34 Feet Assistive device: Straight cane Ambulation/Gait Assistance Details: assist to steady, +2 for safety, (pt not using L UE and with wrist pain so not use RW) if still unsteady and needs more support may try platform RW  next visit Gait Pattern: Step-through pattern;Wide base of support;Decreased stride length;Antalgic Gait velocity: decreased    Exercises     PT Diagnosis: Difficulty walking;Generalized weakness  PT Problem List: Decreased strength;Decreased activity tolerance;Decreased balance;Decreased mobility PT Treatment Interventions: DME instruction;Gait training;Functional mobility training;Therapeutic activities;Therapeutic exercise;Balance training;Neuromuscular re-education;Patient/family education   PT Goals Acute Rehab PT Goals PT Goal Formulation: With patient Time For Goal Achievement: 08/17/12 Potential to Achieve Goals: Good Pt will go Supine/Side to Sit: with supervision PT Goal: Supine/Side to Sit - Progress: Goal set today Pt will go Sit to Supine/Side: with supervision PT Goal: Sit to Supine/Side - Progress: Goal set today Pt will go Sit to Stand: with supervision PT Goal: Sit to Stand - Progress: Goal set today Pt will go Stand to Sit: with supervision PT Goal: Stand to Sit - Progress: Goal set today Pt will Ambulate: 51 - 150 feet;with least restrictive assistive device;with supervision PT Goal: Ambulate - Progress: Goal set today Pt will Perform Home Exercise Program: with supervision, verbal cues required/provided PT Goal: Perform Home Exercise Program - Progress: Goal set today  Visit Information  Last PT Received On: 08/17/12 Assistance Needed: +2 PT/OT Co-Evaluation/Treatment: Yes    Subjective Data  Subjective: Oh that's from an accident.  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 Dominant Hand: Right    Cognition  Cognition Overall Cognitive Status: Appears within functional limits for tasks assessed/performed Arousal/Alertness: Awake/alert Orientation Level: Appears intact for tasks assessed Behavior During Session: [facility] for tasks performed    Extremity/Trunk Assessment Right Upper Extremity Assessment RUE ROM/Strength/Tone: [facility] for tasks assessed Left Upper Extremity Assessment Brennan ROM/Strength/Tone: Deficits Brennan ROM/Strength/Tone Deficits: decreased strength, AROM  - noted especially at wrist- but pt reports better than yesterday Right Lower Extremity Assessment RLE ROM/Strength/Tone: Deficits RLE ROM/Strength/Tone Deficits: poor functional strength observed  Left Lower Extremity Assessment LLE ROM/Strength/Tone: Deficits LLE ROM/Strength/Tone Deficits: poor functional strength observed    Balance    End of Session PT - End of Session Activity Tolerance: Patient limited by fatigue Patient left: in chair;with call bell/phone within reach Nurse Communication: Mobility status  GP     Jayleigh Notarianni,KATHrine E 08/10/2012, 1:05 PM Zenovia Jarred, PT, DPT 08/10/2012 Pager: [phone]

## 2012-08-10 NOTE — H&P (Addendum)
[facility] History and Physical  Darren Brennan:[id number] DOB: 1932-06-14 DOA: 08/09/2012  Referring physician: Dr.Kohut PCP: Kaleen Mask, MD  Specialists:   Chief Complaint: Fever and weakness  HPI: Darren Brennan is a 79 y.o. male history of diabetes mellitus, hypertension, gout, renal insufficiency and CHF presents with above complaints. Per wife patient has had fevers for the past 2 days and has had progressive weakness. He fell while trying to get out of bed and so his wife brought him to the ED. Wife also states that he at fallen about 3 weeks ago and hurt his left wrist. he was seen in by his PCP and x-rays done were reported to be negative. He admits to left wrist pain since that fall. He denies cough, dysuria, diarrhea and no nausea or vomiting. Per wife patient was confused initially when they came to the ED but she states that  resolved. The ED chest x-ray showed minimal atelectasis, x-ray of left wrist was negative for acute fracture, diffuse soft tissue swelling about the wrist was noted and moderate ostial of the first carpometacarpal joint. Patient was febrile to 104 with a blood pressure initially of 70/41-improved with IV fluids to 125/45 and he was tachycardic. EDP attempted LP but was unsuccessful, he was started on empiric vancomycin and Rocephin to cover for possible meningitis. Hand surgeon/Dr. Merlyn Lot was consulted to see patient and he attempted aspiration of the left wrist but this was unsuccessful. He is admitted for further evaluation and management.   Review of Systems: The patient denies anorexia,, weight loss,, vision loss, decreased hearing, hoarseness, chest pain, syncope, dyspnea on exertion, peripheral edema, balance deficits, hemoptysis, abdominal pain, melena, hematochezia, severe indigestion/heartburn, hematuria, incontinence, genital sores, muscle weakness abnormal bleeding, enlarged lymph nodes, angioedema, and breast masses.    Past  Medical History  Diagnosis Date  . Hypertension   . Diabetes mellitus without complication   . Gout   . Renal disorder   . CHF (congestive heart failure)    History reviewed. No pertinent past surgical history. Social History:  reports that he has never smoked. He does not have any smokeless tobacco history on file. He reports that he does not drink alcohol or use illicit drugs. where does patient live--home  No Known Allergies    Prior to Admission medications   Medication Sig Start Date End Date Taking? Authorizing Provider  allopurinol (ZYLOPRIM) 100 MG tablet Take 200 mg by mouth daily.   Yes Historical Provider, MD  HYDROcodone-acetaminophen (NORCO/VICODIN) 5-325 MG per tablet Take 1 tablet by mouth every 6 (six) hours as needed (pain).   Yes Historical Provider, MD  lisinopril (PRINIVIL,ZESTRIL) 40 MG tablet Take 40 mg by mouth daily.   Yes Historical Provider, MD  metFORMIN (GLUCOPHAGE) 1000 MG tablet Take 500 mg by mouth 2 (two) times daily with a meal.   Yes Historical Provider, MD  metoprolol (LOPRESSOR) 50 MG tablet Take 25 mg by mouth daily.   Yes Historical Provider, MD  predniSONE (DELTASONE) 20 MG tablet Take 20 mg by mouth daily. 07/21/12  Yes Historical Provider, MD  tamsulosin (FLOMAX) 0.4 MG CAPS Take 0.4 mg by mouth daily.   Yes Historical Provider, MD   Physical Exam: Filed Vitals:   08/10/12 0050 08/10/12 0100 08/10/12 0110 08/10/12 0120  BP: 109/61 113/59 109/59 110/59  Pulse:      Temp:      TempSrc:      Resp: 13 15 15 18   SpO2:  Constitutional: Vital signs reviewed.  Patient is a well-developed and well-nourished  in no acute distress and cooperative with exam. Alert and oriented x3.  Head: Normocephalic and atraumatic Mouth: no erythema or exudates, MMM Eyes: PERRL, EOMI, conjunctivae normal, No scleral icterus.  Neck: Supple, no meningismus,Trachea midline normal ROM, No JVD, mass, thyromegaly, or carotid bruit present.  Cardiovascular: RRR,  S1 normal, S2 normal, no MRG, pulses symmetric and intact bilaterally Pulmonary/Chest: Decreased breath sounds at bases, no wheezes, rales, or rhonchi Abdominal: Soft. Non-tender, non-distended, bowel sounds are normal, no masses, organomegaly, or guarding present.  GU: no CVA tenderness Extremities: Left wrist edematous tender, lower extremities with no cyanosis and edema  Neurological: A&O x3, Strength is normal and symmetric bilaterally, cranial nerve II-XII are grossly intact, no focal motor deficit, sensory intact to light touch bilaterally.  Skin: Warm, dry and intact. No rash, cyanosis, or clubbing.   Labs on Admission:  Basic Metabolic Panel:  Recent Labs Lab 08/09/12 1545  NA 135  K 3.5  CL 97  CO2 25  GLUCOSE 212*  BUN 32*  CREATININE 1.76*  CALCIUM 9.0   Liver Function Tests: No results found for this basename: AST, ALT, ALKPHOS, BILITOT, PROT, ALBUMIN,  in the last 168 hours No results found for this basename: LIPASE, AMYLASE,  in the last 168 hours No results found for this basename: AMMONIA,  in the last 168 hours CBC:  Recent Labs Lab 08/09/12 1545  WBC 16.5*  NEUTROABS 14.5*  HGB 10.0*  HCT 31.3*  MCV 92.9  PLT 205   Cardiac Enzymes: No results found for this basename: CKTOTAL, CKMB, CKMBINDEX, TROPONINI,  in the last 168 hours  BNP (last 3 results) No results found for this basename: PROBNP,  in the last 8760 hours CBG: No results found for this basename: GLUCAP,  in the last 168 hours  Radiological Exams on Admission: Dg Chest 2 View  08/09/2012  *RADIOLOGY REPORT*  Clinical Data: Fever.  Hypertension.  Diabetes.  Gout.  CHEST - 2 VIEW  Comparison: 07/20/2010  Findings: Superiorly subluxed humeral head on the right favors chronic rotator cuff tear.  Cardiac and mediastinal contours appear unremarkable.  Minimal subsegmental atelectasis noted along the left hemidiaphragm.  Thoracic spondylosis is present.  IMPRESSION:  1.  Minimal left basilar  subsegmental atelectasis. 2.  Suspected chronic rotator cuff tear on the right.   Original Report Authenticated By: Gaylyn Rong, M.D.    Dg Wrist Complete Left  08/09/2012  *RADIOLOGY REPORT*  Clinical Data: Fall.  Left wrist pain and swelling.  LEFT WRIST - COMPLETE 3+ VIEW  Comparison: None.  Findings: The carpals are located.  No acute or healing fracture is identified.  There is moderate osteoarthritis of the first carpometacarpal joint. No acute or healing fracture is identified. There is diffuse soft tissue swelling about the wrist.  IMPRESSION:  1.  Diffuse soft tissue swelling about the wrist is seen.  No acute fracture is identified. 2.  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   Original Report Authenticated By: Britta Mccreedy, M.D.       Assessment/Plan Active Problems:  Fever/SIRS (systemic inflammatory response syndrome) -Unclear source, LP was attempted in ED but unsuccessful as above,  -LP per IR and follow -Blood and urine cultures, also obtain influenza PCR -will continue empiric antibiotics pending above studies   Hypotension -Hypovolemia versus infection, cannot rule out adrenal insufficiency- he has been on prednisone -? For a year per his report -Responded to IV fluids  in ED-continue hydration with close monitoring of fluid status given his history of CHF -Cultures as above on empiric antibiotics -Check cortisol level, stress dose steroids and follow Left wrist pain  -Status post attempted aspiration per hand surgeon, successful as above -His uric acid is mildly elevated at 8 and he has history of gout -started on stress dose steroids as above which should help with possible gout as well, voiding and states now given his renal insufficiency   Gout -As above   Diabetes mellitus without complication -place on on Lantus, cover with sliding scale insulin Chronic steroid use -Stress dose steroids as above and follow. Renal insufficiency-? Acute on  chronic -Baseline unknown, follow and recheck History of CHF -Compensated, monitor fluid status closely with hydration.    Code Status: full Family Communication:   Wife at bedside Disposition Plan: Admit to step down unit  Time spent:   Kela Millin [facility] Pager [phone]  If 7PM-7AM, please contact night-coverage [email or web address] Password [facility] 08/10/2012, 1:34 AM

## 2012-08-10 NOTE — Evaluation (Signed)
Occupational Therapy Evaluation Patient Details Name: Darren Brennan MRN: [id number] DOB: 12-21-1932 Today's Date: 08/10/2012 Time: 4098-1191    OT Assessment / Plan / Recommendation Clinical Impression  Pt presents to OT s/p admission to hospital with fever. Pt with decreased I with ADL activity - noteably- decreased function of L ue.  Pt will benefit from skilled OT to increase I with ADL activity and return to PLOF    OT Assessment  Patient needs continued OT Services    Follow Up Recommendations  Home health OT;Outpatient OT;SNF;Other (comment) (depending on progress)       Equipment Recommendations  Other (comment) (TBD)       Frequency  Min 3X/week    Precautions / Restrictions Precautions Precautions: Fall       ADL  Grooming: Performed;Wash/dry face;Set up Where Assessed - Grooming: Supported sitting Lower Body Dressing: Performed;Minimal assistance Where Assessed - Lower Body Dressing: Unsupported sitting Toilet Transfer: Simulated;Moderate assistance Toilet Transfer Method: Sit to stand Toileting - Clothing Manipulation and Hygiene: Performed;Moderate assistance Where Assessed - Toileting Clothing Manipulation and Hygiene: Standing    OT Diagnosis: Generalized weakness  OT Problem List: Decreased strength;Decreased safety awareness;Decreased activity tolerance;Impaired balance (sitting and/or standing);Decreased range of motion;Decreased cognition;Impaired UE functional use OT Treatment Interventions: Self-care/ADL training;Patient/family education   OT Goals Acute Rehab OT Goals OT Goal Formulation: With patient Potential to Achieve Goals: Good ADL Goals Pt Will Perform Grooming: with supervision;Standing at sink ADL Goal: Grooming - Progress: Goal set today Pt Will Transfer to Toilet: with supervision;Comfort height toilet ADL Goal: Toilet Transfer - Progress: Goal set today Pt Will Perform Toileting - Clothing Manipulation: with  supervision;Standing ADL Goal: Toileting - Clothing Manipulation - Progress: Goal set today Additional ADL Goal #1: Pt will perform HEP indicated by OT with a handout for LUE to increase strength and function I ly ADL Goal: Additional Goal #1 - Progress: Goal set today  Visit Information  Last OT Received On: 08/10/12    Subjective Data  Subjective: this is not my normal walking-    Prior Functioning     Home Living Lives With: Spouse Type of Home: House Home Layout: Multi-level Alternate Level Stairs-Number of Steps: 3 steps Alternate Level Stairs-Rails: Can reach both Bathroom Shower/Tub: Health visitor: Handicapped height Home Adaptive Equipment: Raised toilet seat with rails;Walker - rolling;Crutches Prior Function Level of Independence: Independent Able to Take Stairs?: Yes 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 Dominant Hand: Right         Vision/Perception Vision - History Patient Visual Report: No change from baseline   Cognition  Cognition Overall Cognitive Status: Appears within functional limits for tasks assessed/performed Arousal/Alertness: Awake/alert Orientation Level: Appears intact for tasks assessed Behavior During Session: [facility] for tasks performed    Extremity/Trunk Assessment Right Upper Extremity Assessment RUE ROM/Strength/Tone: [facility] for tasks assessed Left Upper Extremity Assessment LUE ROM/Strength/Tone: Deficits LUE ROM/Strength/Tone Deficits: decreased strength, AROM  - noted especially at wrist- but pt reports better than yesterday     Mobility Bed Mobility Bed Mobility: Rolling Right;Supine to Sit Rolling Right: 4: Min assist;With rail Supine to Sit: 4: Min assist;With rails Transfers Transfers: Sit to Stand;Stand to Sit Sit to Stand: 1: +2 Total assist Sit to Stand: Patient Percentage: 70% Stand to Sit: 1: +2 Total assist Stand to Sit: Patient Percentage: 70% Details  for Transfer Assistance: Pt not using LUE and holding his LUE up.  Pt states arm better than yesterday- but grip much weaker  End of Session OT - End of Session Activity Tolerance: Patient tolerated treatment well Patient left: in chair;with call bell/phone within reach  GO     [facility], [place] 08/10/2012, 11:42 AM

## 2012-08-11 DIAGNOSIS — I509 Heart failure, unspecified: Secondary | ICD-10-CM

## 2012-08-11 DIAGNOSIS — D638 Anemia in other chronic diseases classified elsewhere: Secondary | ICD-10-CM

## 2012-08-11 DIAGNOSIS — N179 Acute kidney failure, unspecified: Secondary | ICD-10-CM

## 2012-08-11 DIAGNOSIS — N189 Chronic kidney disease, unspecified: Secondary | ICD-10-CM

## 2012-08-11 LAB — URINE CULTURE: Colony Count: >=100000

## 2012-08-11 LAB — BASIC METABOLIC PANEL
BUN: 28 mg/dL — ABNORMAL HIGH (ref 6–23)
Calcium: 8.5 mg/dL (ref 8.4–10.5)
GFR calc Af Amer: 65 mL/min — ABNORMAL LOW (ref >90)
GFR calc non Af Amer: 56 mL/min — ABNORMAL LOW (ref >90)
Glucose, Bld: 167 mg/dL — ABNORMAL HIGH (ref 70–99)
Potassium: 3.6 mEq/L (ref 3.5–5.1)

## 2012-08-11 LAB — CBC
HCT: 25.6 % — ABNORMAL LOW (ref 39.0–52.0)
MCH: 29.9 pg (ref 26.0–34.0)
MCHC: 32.8 g/dL (ref 30.0–36.0)
RDW: 14.1 % (ref 11.5–15.5)

## 2012-08-11 LAB — GLUCOSE, CAPILLARY
Glucose-Capillary: 166 mg/dL — ABNORMAL HIGH (ref 70–99)
Glucose-Capillary: 231 mg/dL — ABNORMAL HIGH (ref 70–99)

## 2012-08-11 NOTE — Progress Notes (Signed)
Patient ID: Darren Brennan, male   DOB: Jan 14, 1933, 79 y.o.   MRN: [id number]  [facility] PROGRESS NOTE  Darren Brennan FAO:[id number] DOB: 1933/05/14 DOA: 08/09/2012 PCP: Darren Mask, MD  Brief narrative: Pt is 79 y.o.male with history of diabetes, gout chronic renal failure stage I - II, who presented to [facility] ED with main concern of 1 week in duration fever. He explained about 3 weeks piror to this admission he fell and hurt his left wrist and the xray that was done in PCP office was negative for fractures but diffuse swelling was noted. In ED, pt with T 104F and with BP 70/40. IVF given on ED and pt's BP increased to 125/70. [facility] asked to admit for possible meningitis and asked for LP to be done.   Principal Problem:   Fever - now resolved - will continue current antibiotics until final LP results are back - supportive care Active Problems:   Gout - favored to be the cause of the acute left wrist pain at this point - continue supportive care   SIRS (systemic inflammatory response syndrome) - unclear etiology at this time - will continue broad spectrum abx for now and will narrow down spectrum as possible  - awaiting for LP studies to be come back   Hypotension - much improved with IVF - will continue to monitor vitals per floor protocol   Acute on chronic renal failure - creatinine is now within normal range - BMP in AM   Diabetes mellitus without complication - reasonable inpatient control    CHF (congestive heart failure) - clinically stable at this point    Anemia of chronic disease - slight drop in Hg/Hct since admission - will keep close eye on CBC  Consultants:  None  Procedures/Studies: Dg Chest 2 View 08/09/2012 Minimal left basilar subsegmental atelectasis. Suspected chronic rotator cuff tear on the right.      Dg Wrist Complete Left3/20/2014  Diffuse soft tissue swelling about the wrist is seen.  No acute fracture is identified. Moderate  osteoarthritis of the first carpometacarpal joint.    Ct Head Wo Contrast 08/10/2012  No acute intracranial abnormalities.    Dg Fluoro Guide Lumbar Puncture 08/10/2012 Technically successful fluoroscopically guided lumbar puncture at L3-4.   Antibiotics:  Rocephin 03/21 -->  Vancomycin 03/21 -->  Ampicillin 03/21 -->  Code Status: Full Family Communication: Pt at bedside Disposition Plan: Home when medically stable  HPI/Subjective: No events overnight.   Objective: Filed Vitals:   08/10/12 0949 08/10/12 1337 08/10/12 2241 08/11/12 0605  BP:  112/57 125/75 146/77  Pulse: 87 80 69 71  Temp:  97.2 F (36.2 C) 97.8 F (36.6 C) 97.4 F (36.3 C)  TempSrc:  Oral Oral Oral  Resp:  20 18 16   Height:      Weight:      SpO2: 98% 100% 99% 99%    Intake/Output Summary (Last 24 hours) at 08/11/12 1530 Last data filed at 08/11/12 1000  Gross per 24 hour  Intake 2586.67 ml  Output    800 ml  Net 1786.67 ml    Exam:   General:  Pt is alert, follows commands appropriately, not in acute distress  Cardiovascular: Regular rate and rhythm, S1/S2, no murmurs, no rubs, no gallops  Respiratory: Clear to auscultation bilaterally, no wheezing, no crackles, no rhonchi  Abdomen: Soft, non tender, non distended, bowel sounds present, no guarding  Extremities: No edema, pulses DP and PT palpable bilaterally  Neuro: Grossly nonfocal  Data Reviewed: Basic Metabolic Panel:  Recent Labs Lab 08/09/12 1545 08/10/12 0435 08/11/12 0440  NA 135 138 137  K 3.5 3.1* 3.6  CL 97 104 103  CO2 25 27 25   GLUCOSE 212* 131* 167*  BUN 32* 29* 28*  CREATININE 1.76* 1.55* 1.21  CALCIUM 9.0 7.8* 8.5   CBC:  Recent Labs Lab 08/09/12 1545 08/10/12 0440 08/11/12 0440  WBC 16.5* 11.9* 14.2*  NEUTROABS 14.5*  --   --   HGB 10.0* 8.3* 8.4*  HCT 31.3* 26.0* 25.6*  MCV 92.9 92.9 91.1  PLT 205 163 177   CBG:  Recent Labs Lab 08/10/12 0146 08/10/12 0553 08/10/12 1156 08/10/12 1738  08/10/12 2127  GLUCAP 117* 128* 201* 220* 241*    Recent Results (from the past 240 hour(s))  URINE CULTURE     Status: None   Collection Time    08/09/12  3:15 PM      Result Value Range Status   Specimen Description URINE, RANDOM   Final   Special Requests NONE   Final   Culture  Setup Time 08/10/2012 04:36   Final   Colony Count >=100,000 COLONIES/ML   Final   Culture ESCHERICHIA COLI   Final   Report Status PENDING   Incomplete  CULTURE, BLOOD (ROUTINE X 2)     Status: None   Collection Time    08/09/12  3:40 PM      Result Value Range Status   Specimen Description BLOOD RIGHT ARM   Final   Special Requests BOTTLES DRAWN AEROBIC AND ANAEROBIC 5CC   Final   Culture  Setup Time 08/09/2012 23:07   Final   Culture     Final   Value:        BLOOD CULTURE RECEIVED NO GROWTH TO DATE CULTURE WILL BE HELD FOR 5 DAYS BEFORE ISSUING A FINAL NEGATIVE REPORT   Report Status PENDING   Incomplete  CULTURE, BLOOD (ROUTINE X 2)     Status: None   Collection Time    08/09/12  3:45 PM      Result Value Range Status   Specimen Description BLOOD LEFT ARM   Final   Special Requests BOTTLES DRAWN AEROBIC AND ANAEROBIC 3CC   Final   Culture  Setup Time 08/09/2012 23:07   Final   Culture     Final   Value:        BLOOD CULTURE RECEIVED NO GROWTH TO DATE CULTURE WILL BE HELD FOR 5 DAYS BEFORE ISSUING A FINAL NEGATIVE REPORT   Report Status PENDING   Incomplete  BODY FLUID CULTURE     Status: None   Collection Time    08/10/12  2:30 PM      Result Value Range Status   Specimen Description CSF   Final   Special Requests NONE   Final   Gram Stain     Final   Value: NO WBC SEEN     NO ORGANISMS SEEN   Culture NO GROWTH   Final   Report Status PENDING   Incomplete  GRAM STAIN     Status: None   Collection Time    08/10/12  2:30 PM      Result Value Range Status   Specimen Description CSF   Final   Special Requests NONE   Final   Gram Stain     Final   Value: NO ORGANISMS SEEN     NO WBC  SEEN     Gram  Stain Report Called to,Read Back By and Verified With: SISON,G. AT 1716 ON [id number] BY LOVE,T.   Report Status 08/10/2012 FINAL   Final     Scheduled Meds: . allopurinol  200 mg Oral Daily  . ampicillin IV  2 g Intravenous Q6H  . cefTRIAXone  IV  2 g Intravenous Q12H  . enoxaparin injection  40 mg Subcutaneous Daily  . hydrocortisone injection  50 mg Intravenous Q8H  . insulin aspart  0-5 Units Subcutaneous QHS  . insulin aspart  0-9 Units Subcutaneous TID WC  . insulin glargine  10 Units Subcutaneous Daily  . tamsulosin  0.4 mg Oral Daily  . vancomycin  750 mg Intravenous Q12H   Continuous Infusions: . sodium chloride 100 mL/hr at 08/11/12 0401     Debbora Presto, MD  [facility] Pager [phone]  If 7PM-7AM, please contact night-coverage [email or web address] Password [email or web address] 08/11/2012, 3:30 PM   LOS: 2 days

## 2012-08-12 LAB — CBC
MCH: 30.6 pg (ref 26.0–34.0)
MCHC: 33.9 g/dL (ref 30.0–36.0)
MCV: 90.3 fL (ref 78.0–100.0)
Platelets: 189 10*3/uL (ref 150–400)
RBC: 2.58 MIL/uL — ABNORMAL LOW (ref 4.22–5.81)
RDW: 14.2 % (ref 11.5–15.5)

## 2012-08-12 LAB — GLUCOSE, CAPILLARY
Glucose-Capillary: 225 mg/dL — ABNORMAL HIGH (ref 70–99)
Glucose-Capillary: 127 mg/dL — ABNORMAL HIGH (ref 70–99)

## 2012-08-12 LAB — BASIC METABOLIC PANEL
BUN: 24 mg/dL — ABNORMAL HIGH (ref 6–23)
CO2: 23 mEq/L (ref 19–32)
Calcium: 8.6 mg/dL (ref 8.4–10.5)
Chloride: 105 mEq/L (ref 96–112)
Creatinine, Ser: 1.12 mg/dL (ref 0.50–1.35)

## 2012-08-12 MED ORDER — DOXYCYCLINE MONOHYDRATE 100 MG PO TABS: 100.0000 mg | ORAL_TABLET | Freq: Two times a day (BID) | ORAL | Status: AC

## 2012-08-12 MED ORDER — POTASSIUM CHLORIDE CRYS ER 20 MEQ PO TBCR
40.0000 meq | EXTENDED_RELEASE_TABLET | Freq: Once | ORAL | Status: AC
  Administered 2012-08-12: 40 meq via ORAL
  Filled 2012-08-12: qty 2

## 2012-08-12 NOTE — Progress Notes (Signed)
Subjective: No pain left wrist.  Expects d/c home today.  No fevers.   Objective: Vital signs in last 24 hours: Temp:  [97.8 F (36.6 C)-97.9 F (36.6 C)] 97.9 F (36.6 C) (03/23 0452) Pulse Rate:  [66-72] 72 (03/23 0452) Resp:  [18] 18 (03/23 0452) BP: (140-148)/(71-82) 148/82 mmHg (03/23 0452) SpO2:  [99 %-100 %] 99 % (03/23 0452)  Intake/Output from previous day: 03/22 0701 - 03/23 0700 In: 2038.3 [P.O.:150; I.V.:838.3; IV Piggyback:1050] Out: 550 [Urine:550] Intake/Output this shift: Total I/O In: 360 [P.O.:360] Out: 400 [Urine:400]   Recent Labs  08/09/12 1545 08/10/12 0440 08/11/12 0440 08/12/12 0514  HGB 10.0* 8.3* 8.4* 7.9*    Recent Labs  08/11/12 0440 08/12/12 0514  WBC 14.2* 10.9*  RBC 2.81* 2.58*  HCT 25.6* 23.3*  PLT 177 189    Recent Labs  08/11/12 0440 08/12/12 0514  NA 137 138  K 3.6 3.0*  CL 103 105  CO2 25 23  BUN 28* 24*  CREATININE 1.21 1.12  GLUCOSE 167* 158*  CALCIUM 8.5 8.6   No results found for this basename: LABPT, INR,  in the last 72 hours  intact sensation and capillary refill all digits.  a/prom wrist with no pain.  no erythema.  Assessment/Plan: Left wrist pain likely due to gout.  Follow up as outpatient prn.   Sanoe Hazan R 08/12/2012, 10:17 AM

## 2012-08-12 NOTE — Discharge Summary (Signed)
Physician Discharge Summary  Darren Brennan XBJ:[id number] DOB: 01-23-1933 DOA: 08/09/2012  PCP: Kaleen Mask, MD  Admit date: 08/09/2012 Discharge date: 08/12/2012  Recommendations for Outpatient Follow-up:  1. Pt will need to follow up with PCP in 2-3 weeks post discharge 2. Please obtain BMP to evaluate electrolytes and kidney function, potassium 40 mEq x1 dose given prior to discharge so please also check potassium levels 3. Please also check CBC to evaluate Hg and Hct levels 4. Please note that patient was insisting to leave the hospital as he reported he was feeling better  Discharge Diagnoses: Fever secondary to gout Principal Problem:   Fever Active Problems:   Gout   SIRS (systemic inflammatory response syndrome)   Hypotension   Acute on chronic renal failure   Diabetes mellitus without complication   CHF (congestive heart failure)   Anemia of chronic disease  Discharge Condition: Stable  Diet recommendation: Heart healthy diet discussed in details   Brief narrative:  Pt is 79 y.o.male with history of diabetes, gout chronic renal failure stage I - II, who presented to [facility] ED with main concern of 1 week in duration fever. He explained about 3 weeks piror to this admission he fell and hurt his left wrist and the xray that was done in PCP office was negative for fractures but diffuse swelling was noted. In ED, pt with T 104F and with BP 70/40. IVF given on ED and pt's BP increased to 125/70. TRH asked to admit for possible meningitis and asked for LP to be done.   Principal Problem:  Fever  - Likely secondary to gout rather than acute meningitis, now resolved  - LP findings discussed with neurologist on call and no concern for meningitis at this time - Patient will continue taking doxycycline upon discharge for 5 more days Active Problems:  Gout  - favored to be the cause of the acute left wrist pain at this point  - continued supportive care and patient has  responded well SIRS (systemic inflammatory response syndrome)  - unclear etiology at this time and thought to be secondary to gout flare - Patient initially started on broad-spectrum antibiotics as there was a concern for acute meningitis however LP findings were not consistent with antibiotics for narrowed down to doxycycline  Hypotension  - much improved with IVF Acute on chronic renal failure  - creatinine is now within normal range  Diabetes mellitus without complication  - reasonable inpatient control  CHF (congestive heart failure)  - clinically stable at this point  Anemia of chronic disease  - Hemoglobin and hematocrit remained relatively stable during the hospital stay  Consultants:  None Procedures/Studies:  Dg Chest 2 View 08/09/2012 Minimal left basilar subsegmental atelectasis. Suspected chronic rotator cuff tear on the right.  Dg Wrist Complete Left3/20/2014 Diffuse soft tissue swelling about the wrist is seen. No acute fracture is identified. 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  Ct Head Wo Contrast 08/10/2012 No acute intracranial abnormalities.  Dg Fluoro Guide Lumbar Puncture 08/10/2012 Technically successful fluoroscopically guided lumbar puncture at L3-4.  Antibiotics:  Rocephin 03/21 --> 03/23 Vancomycin 03/21 --> 03/23 Ampicillin 03/21 --> 03/23 Code Status: Full  Family Communication: Pt at bedside   Discharge Exam: Filed Vitals:   08/12/12 0452  BP: 148/82  Pulse: 72  Temp: 97.9 F (36.6 C)  Resp: 18   Filed Vitals:   08/11/12 0605 08/11/12 1624 08/11/12 2158 08/12/12 0452  BP: 146/77 140/71 146/79 148/82  Pulse: 71 69  66 72  Temp: 97.4 F (36.3 C) 97.8 F (36.6 C) 97.8 F (36.6 C) 97.9 F (36.6 C)  TempSrc: Oral Oral Oral Oral  Resp: 16 18 18 18   Height:      Weight:      SpO2: 99% 100% 100% 99%    General: Pt is alert, follows commands appropriately, not in acute distress Cardiovascular: Regular rate and rhythm,  S1/S2 +, no murmurs, no rubs, no gallops Respiratory: Clear to auscultation bilaterally, no wheezing, no crackles, no rhonchi Abdominal: Soft, non tender, non distended, bowel sounds +, no guarding Extremities: no edema, no cyanosis, pulses palpable bilaterally DP and PT Neuro: Grossly nonfocal  Discharge Instructions  Discharge Orders   Future Appointments Provider Department Dept Phone   08/24/2012 1:30 PM [facility] [phone]   08/24/2012 1:45 PM Krista Blue [facility] [phone]   08/24/2012 2:00 PM Benjiman Core, MD [facility] [phone]   Future Orders Complete By Expires     Diet - low sodium heart healthy  As directed     Increase activity slowly  As directed         Medication List    TAKE these medications       allopurinol 100 MG tablet  Commonly known as:  ZYLOPRIM  Take 200 mg by mouth daily.     doxycycline 100 MG tablet  Commonly known as:  ADOXA  Take 1 tablet (100 mg total) by mouth 2 (two) times daily.     HYDROcodone-acetaminophen 5-325 MG per tablet  Commonly known as:  NORCO/VICODIN  Take 1 tablet by mouth every 6 (six) hours as needed (pain).     lisinopril 40 MG tablet  Commonly known as:  PRINIVIL,ZESTRIL  Take 40 mg by mouth daily.     metFORMIN 1000 MG tablet  Commonly known as:  GLUCOPHAGE  Take 500 mg by mouth 2 (two) times daily with a meal.     metoprolol 50 MG tablet  Commonly known as:  LOPRESSOR  Take 25 mg by mouth daily.     predniSONE 20 MG tablet  Commonly known as:  DELTASONE  Take 20 mg by mouth daily.     tamsulosin 0.4 MG Caps  Commonly known as:  FLOMAX  Take 0.4 mg by mouth daily.           Follow-up Information   Follow up with Kaleen Mask, MD In 2 weeks.   Contact information:   [address] [place] [address] [phone]        The results of  significant diagnostics from this hospitalization (including imaging, microbiology, ancillary and laboratory) are listed below for reference.     Microbiology: Recent Results (from the past 240 hour(s))  URINE CULTURE     Status: None   Collection Time    08/09/12  3:15 PM      Result Value Range Status   Specimen Description URINE, RANDOM   Final   Special Requests NONE   Final   Culture  Setup Time 08/10/2012 04:36   Final   Colony Count >=100,000 COLONIES/ML   Final   Culture ESCHERICHIA COLI   Final   Report Status 08/11/2012 FINAL   Final   Organism ID, Bacteria ESCHERICHIA COLI   Final  CULTURE, BLOOD (ROUTINE X 2)     Status: None   Collection Time    08/09/12  3:40  PM      Result Value Range Status   Specimen Description BLOOD RIGHT ARM   Final   Special Requests BOTTLES DRAWN AEROBIC AND ANAEROBIC 5CC   Final   Culture  Setup Time 08/09/2012 23:07   Final   Culture     Final   Value:        BLOOD CULTURE RECEIVED NO GROWTH TO DATE CULTURE WILL BE HELD FOR 5 DAYS BEFORE ISSUING A FINAL NEGATIVE REPORT   Report Status PENDING   Incomplete  CULTURE, BLOOD (ROUTINE X 2)     Status: None   Collection Time    08/09/12  3:45 PM      Result Value Range Status   Specimen Description BLOOD LEFT ARM   Final   Special Requests BOTTLES DRAWN AEROBIC AND ANAEROBIC 3CC   Final   Culture  Setup Time 08/09/2012 23:07   Final   Culture     Final   Value:        BLOOD CULTURE RECEIVED NO GROWTH TO DATE CULTURE WILL BE HELD FOR 5 DAYS BEFORE ISSUING A FINAL NEGATIVE REPORT   Report Status PENDING   Incomplete  BODY FLUID CULTURE     Status: None   Collection Time    08/10/12  2:30 PM      Result Value Range Status   Specimen Description CSF   Final   Special Requests NONE   Final   Gram Stain     Final   Value: NO WBC SEEN     NO ORGANISMS SEEN   Culture NO GROWTH   Final   Report Status PENDING   Incomplete  GRAM STAIN     Status: None   Collection Time    08/10/12  2:30 PM       Result Value Range Status   Specimen Description CSF   Final   Special Requests NONE   Final   Gram Stain     Final   Value: NO ORGANISMS SEEN     NO WBC SEEN     Gram Stain Report Called to,Read Back By and Verified With: SISON,G. AT 1716 ON [id number] BY LOVE,T.   Report Status 08/10/2012 FINAL   Final     Labs: Basic Metabolic Panel:  Recent Labs Lab 08/09/12 1545 08/10/12 0435 08/11/12 0440 08/12/12 0514  NA 135 138 137 138  K 3.5 3.1* 3.6 3.0*  CL 97 104 103 105  CO2 25 27 25 23   GLUCOSE 212* 131* 167* 158*  BUN 32* 29* 28* 24*  CREATININE 1.76* 1.55* 1.21 1.12  CALCIUM 9.0 7.8* 8.5 8.6   CBC:  Recent Labs Lab 08/09/12 1545 08/10/12 0440 08/11/12 0440 08/12/12 0514  WBC 16.5* 11.9* 14.2* 10.9*  NEUTROABS 14.5*  --   --   --   HGB 10.0* 8.3* 8.4* 7.9*  HCT 31.3* 26.0* 25.6* 23.3*  MCV 92.9 92.9 91.1 90.3  PLT 205 163 177 189   CBG:  Recent Labs Lab 08/11/12 0723 08/11/12 1145 08/11/12 1639 08/11/12 2155 08/12/12 0742  GLUCAP 166* 231* 140* 225* 127*   SIGNED: Time coordinating discharge: Over 30 minutes  Debbora Presto, MD  [facility] 08/12/2012, 9:40 AM Pager [phone]  If 7PM-7AM, please contact night-coverage [email or web address] Password [email or web address]

## 2012-08-14 LAB — BODY FLUID CULTURE: Culture: NO GROWTH

## 2012-08-15 LAB — CULTURE, BLOOD (ROUTINE X 2)
Culture: NO GROWTH
Culture: NO GROWTH

## 2012-08-19 ENCOUNTER — Other Ambulatory Visit: Payer: Self-pay | Admitting: Oncology

## 2012-08-19 DIAGNOSIS — D638 Anemia in other chronic diseases classified elsewhere: Secondary | ICD-10-CM

## 2012-08-24 ENCOUNTER — Ambulatory Visit: Payer: Medicare Other

## 2012-08-24 ENCOUNTER — Ambulatory Visit (HOSPITAL_BASED_OUTPATIENT_CLINIC_OR_DEPARTMENT_OTHER): Payer: Medicare Other | Admitting: Oncology

## 2012-08-24 ENCOUNTER — Telehealth: Payer: Self-pay | Admitting: Oncology

## 2012-08-24 ENCOUNTER — Encounter: Payer: Self-pay | Admitting: Oncology

## 2012-08-24 ENCOUNTER — Other Ambulatory Visit (HOSPITAL_BASED_OUTPATIENT_CLINIC_OR_DEPARTMENT_OTHER): Payer: Medicare Other | Admitting: Lab

## 2012-08-24 VITALS — BP 108/66 | HR 73 | Temp 96.2°F | Resp 18 | Ht 71.0 in | Wt 161.0 lb

## 2012-08-24 DIAGNOSIS — D638 Anemia in other chronic diseases classified elsewhere: Secondary | ICD-10-CM

## 2012-08-24 DIAGNOSIS — D649 Anemia, unspecified: Secondary | ICD-10-CM

## 2012-08-24 LAB — COMPREHENSIVE METABOLIC PANEL (CC13)
ALT: 15 U/L (ref 0–55)
BUN: 33.6 mg/dL — ABNORMAL HIGH (ref 7.0–26.0)
CO2: 28 mEq/L (ref 22–29)
Calcium: 9.3 mg/dL (ref 8.4–10.4)
Chloride: 101 mEq/L (ref 98–107)
Creatinine: 1.8 mg/dL — ABNORMAL HIGH (ref 0.7–1.3)
Glucose: 150 mg/dl — ABNORMAL HIGH (ref 70–99)
Total Bilirubin: 0.65 mg/dL (ref 0.20–1.20)

## 2012-08-24 LAB — CBC WITH DIFFERENTIAL/PLATELET
Basophils Absolute: 0 10*3/uL (ref 0.0–0.1)
Eosinophils Absolute: 0 10*3/uL (ref 0.0–0.5)
HCT: 31 % — ABNORMAL LOW (ref 38.4–49.9)
HGB: 10 g/dL — ABNORMAL LOW (ref 13.0–17.1)
LYMPH%: 5.4 % — ABNORMAL LOW (ref 14.0–49.0)
MCHC: 32.3 g/dL (ref 32.0–36.0)
MONO#: 0.3 10*3/uL (ref 0.1–0.9)
NEUT%: 92.3 % — ABNORMAL HIGH (ref 39.0–75.0)
Platelets: 318 10*3/uL (ref 140–400)
WBC: 15.1 10*3/uL — ABNORMAL HIGH (ref 4.0–10.3)
lymph#: 0.8 10*3/uL — ABNORMAL LOW (ref 0.9–3.3)

## 2012-08-24 LAB — CHCC SMEAR

## 2012-08-24 NOTE — Progress Notes (Signed)
CC:   Windle Guard, M.D.  REASON FOR CONSULTATION:  Anemia.  HISTORY OF THE PRESENT ILLNESS:  Mr. Pelissier is a pleasant 79 year old gentleman.  A native of [place], currently of [place].  He has been living in this area since the 1970s and currently retired.  He also served in the [employer].  He is a gentleman with a past medical history significant for hypertension, diabetes, and gout.  He had presented acutely on 08/09/2012 with symptoms of weakness, fatigue, and fever and found to have a gout flare.  He also had altered mental status, suspicious to have meningitis, but his workup did not support that diagnosis.  He was discharged on March 23rd after improvement in his condition and felt that his fever probably related to the gout rather than meningitis and was treated with antibiotics.  During his stay, he was noted to be anemic with a hemoglobin as low as 7.9 on March 23rd. It was 8.3 at presentation.  His sed rate was 63.  His MCV was normal, ranging between 90.8 to 93.  He had a normal RDW.  The patient followed up and was discharged with Dr. Jeannetta Nap, his primary care physician.  His previous CBC back on March 5th, his hemoglobin was 10.3, white cell count of 11.8.  He had been on iron supplements in the past.  His ferritin was repeated, and it was 371.  Clinically he reports he is feeling a lot better since his gouty flare. His activity level improved, he is back driving.  Although he has a limited performance status, he still performs most activities of daily living without any hindrance or decline.  He had not reported any bleeding, did not report any hemoptysis, did not report any hematemesis, and did not report any hematuria.  Did not report any musculoskeletal complaints.  Did not report any constitutional symptoms.  He had a colonoscopy done in the last couple of years.  REVIEW OF SYSTEMS:  He does not report any headaches, blurry vision, double vision.  Does not  report any motor or sensory neuropathy.  Does not report any alteration in mental status.  Does not report any psychiatric issues, depression.  Does not report any fever, chills, or sweats.  Does not report any cough, hemoptysis, or hematemesis.  No nausea, vomiting.  Does not report any abdominal pain, hematochezia, melena.  The rest of the review of systems unremarkable.  PAST MEDICAL HISTORY:  Significant for hypertension, diabetes, gout, congestive heart failure.  He has a history of chronic renal insufficiency.  MEDICATIONS:  He is on Flomax, currently on prednisone, Lopressor, Glucophage, lisinopril, Norco, and allopurinol.  ALLERGIES:  None.  FAMILY HISTORY:  Significant for diabetes but no other cancers or blood disorders.  SOCIAL HISTORY:  He is married.  He has 2 children.  Denied any alcohol or tobacco abuse.  Worked in Associate Professor.  Also was in the [employer]. Currently retired.  PHYSICAL EXAMINATION:  Alert, awake, pleasant gentleman, appeared in no active distress today.  His blood pressure is 108/66, pulse is 73, respirations 18, temperature is 96.  Weighs 161 pounds.  HEENT:  Head is normocephalic, atraumatic.  Pupils equal, round, and reactive to light. Oral mucosa are moist and pink.  Neck:  Supple.  No lymphadenopathy. Heart:  Regular rate and rhythm.  S1, S2.  Lungs:  Clear to auscultation.  No rhonchi or wheeze, no dullness to percussion. Abdomen:  Soft, nontender.  No hepatosplenomegaly.  Extremities:  No clubbing, cyanosis, or edema.  Neurologic:  Intact motor, sensory, and deep tendon reflexes.  LABORATORY DATA:  Hemoglobin of 10, white cell count of 15, platelet count of 318.  His MCV is 90.  His differential showed 92% neutrophils, otherwise the differential is normal.  Peripheral smear was personally reviewed today, could not really appreciate any dysplasia, could not appreciate any red cell schistocytosis, red cell fragmentation.  Could not see any  polychromasia or spherocytosis.  ASSESSMENT/PLAN:  A 79 year old gentleman with the following issues: Normocytic normochromic anemia.  The differential diagnosis was discussed today with Mr. Drotar and his family.  He definitely has an element of multifactorial anemia, anemia of chronic disease due to his diabetes and congestive heart failure.  It could have an element of renal disease.  His creatinine today is at 1.8 with a creatinine clearance of less than 60 cc/minute.  Other conditions include a myelodysplastic syndrome, plasma cell disorder are also a consideration. His hemoglobin has improved significantly since his discharge from the hospital.  This could be also chronic inflammatory conditions associated with gout causes a degree of anemia.  In terms of management, at this point, he is asymptomatic.  His hemoglobin is improving.  I have talked to him about using growth factor support in the form of Procrit or Aranesp.  He is certainly open to it if his hemoglobin drops further down or he develops any symptomatology, and given the fact that his hemoglobin is actually improving, he is okay holding off on this at this point.  Again I will work him up for possible plasma cell disorder to rule out multiple myeloma.  At this point, we will obtain a serum protein electrophoresis.  Certainly if he develops any other cytopenias, we will consider a bone marrow biopsy as well.  All of his questions were answered today.    ______________________________ Benjiman Core, M.D. FNS/MEDQ  D:  08/24/2012  T:  08/24/2012  Job:  [id number]

## 2012-08-24 NOTE — Progress Notes (Signed)
Note dictated

## 2012-08-24 NOTE — Telephone Encounter (Signed)
gv and printed appt schedule for July

## 2012-08-24 NOTE — Progress Notes (Signed)
Checked in new pt with no financial concerns. °

## 2012-08-28 LAB — SPEP & IFE WITH QIG
Albumin ELP: 55.7 % — ABNORMAL LOW (ref 55.8–66.1)
Beta 2: 5.3 % (ref 3.2–6.5)
Beta Globulin: 6.6 % (ref 4.7–7.2)
Gamma Globulin: 12.8 % (ref 11.1–18.8)
IgA: 208 mg/dL (ref 68–379)
IgG (Immunoglobin G), Serum: 694 mg/dL (ref 650–1600)
IgM, Serum: 104 mg/dL (ref 41–251)

## 2012-08-28 LAB — ERYTHROPOIETIN: Erythropoietin: 16.1 m[IU]/mL (ref 2.6–18.5)

## 2012-09-06 ENCOUNTER — Encounter: Payer: Self-pay | Admitting: Oncology

## 2012-12-04 ENCOUNTER — Telehealth: Payer: Self-pay | Admitting: Oncology

## 2012-12-04 ENCOUNTER — Ambulatory Visit (HOSPITAL_BASED_OUTPATIENT_CLINIC_OR_DEPARTMENT_OTHER): Payer: Medicare Other | Admitting: Oncology

## 2012-12-04 ENCOUNTER — Other Ambulatory Visit (HOSPITAL_BASED_OUTPATIENT_CLINIC_OR_DEPARTMENT_OTHER): Payer: Medicare Other | Admitting: Lab

## 2012-12-04 VITALS — BP 111/60 | HR 66 | Temp 97.0°F | Resp 18 | Ht 71.0 in | Wt 165.9 lb

## 2012-12-04 DIAGNOSIS — D638 Anemia in other chronic diseases classified elsewhere: Secondary | ICD-10-CM

## 2012-12-04 LAB — CBC WITH DIFFERENTIAL/PLATELET
BASO%: 0.6 % (ref 0.0–2.0)
Basophils Absolute: 0.1 10*3/uL (ref 0.0–0.1)
EOS%: 4.1 % (ref 0.0–7.0)
HGB: 10.7 g/dL — ABNORMAL LOW (ref 13.0–17.1)
MCH: 30.8 pg (ref 27.2–33.4)
MCHC: 33.3 g/dL (ref 32.0–36.0)
MCV: 92.5 fL (ref 79.3–98.0)
MONO%: 10.5 % (ref 0.0–14.0)
NEUT%: 56.7 % (ref 39.0–75.0)
RDW: 15.1 % — ABNORMAL HIGH (ref 11.0–14.6)

## 2012-12-04 LAB — COMPREHENSIVE METABOLIC PANEL (CC13)
AST: 16 U/L (ref 5–34)
Alkaline Phosphatase: 49 U/L (ref 40–150)
BUN: 26.6 mg/dL — ABNORMAL HIGH (ref 7.0–26.0)
Creatinine: 1.8 mg/dL — ABNORMAL HIGH (ref 0.7–1.3)

## 2012-12-04 NOTE — Telephone Encounter (Signed)
gv and printed appt sched and avs for pt  °

## 2012-12-04 NOTE — Progress Notes (Signed)
Hematology and Oncology Follow Up Visit  Darren Brennan [id number] May 16, 1933 79 y.o. 12/04/2012 3:51 PM Darren Brennan, MDElkins, Darren Brennan, *   Darren Brennan: 79 year old gentleman with multifactorial anemia with combination of anemia of chronic disease and anemia of renal insufficiency. He could have early myelodysplastic syndrome.   Current therapy: Observation and surveillance.  Interim History:  Darren Brennan is as today for a followup visit. He is a pleasant 79 year old gentleman I saw for the first time back in April of 2014 for the evaluation of normocytic normochromic anemia. His workup was unrevealing at that time. His serum protein electrophoresis was normal. His erythropoietin was inappropriately normal indicating possibly anemia of renal disease. Since his last visit, he has been asymptomatic. His energy, performance status and activity level limited but continues to be at baseline. His continued to able to drive and that test is activity of daily living.  Medications: I have reviewed the patient's current medications.  Current Outpatient Prescriptions  Medication Sig Dispense Refill  . allopurinol (ZYLOPRIM) 100 MG tablet Take 200 mg by mouth daily.      Marland Kitchen ENSURE (ENSURE) Take 237 mLs by mouth daily as needed.      . furosemide (LASIX) 40 MG tablet Take 40 mg by mouth daily.      Marland Kitchen HYDROcodone-acetaminophen (NORCO/VICODIN) 5-325 MG per tablet Take 1 tablet by mouth every 6 (six) hours as needed (pain).      Marland Kitchen lisinopril (PRINIVIL,ZESTRIL) 40 MG tablet Take 40 mg by mouth daily.      . metoprolol (LOPRESSOR) 50 MG tablet Take 25 mg by mouth daily.      . tamsulosin (FLOMAX) 0.4 MG CAPS Take 0.4 mg by mouth daily.       No current facility-administered medications for this visit.     Allergies: No Known Allergies  Past Medical History, Surgical history, Social history, and Family History were reviewed and updated.  Review of Systems: Constitutional:   Negative for fever, chills, night sweats, anorexia, weight loss, pain. Cardiovascular: no chest pain or dyspnea on exertion Respiratory: negative Neurological: negative Dermatological: negative ENT: negative Skin: Negative. Gastrointestinal: negative Genito-Urinary: negative Hematological and Lymphatic: negative Breast: negative Musculoskeletal: negative Remaining ROS negative. Physical Exam: Blood pressure 111/60, pulse 66, temperature 97 F (36.1 C), temperature source Oral, resp. rate 18, height 5\' 11"  (1.803 m), weight 165 lb 14.4 oz (75.252 kg). ECOG: 1 General appearance: alert Head: Normocephalic, without obvious abnormality, atraumatic Neck: no adenopathy, no carotid bruit, no JVD, supple, symmetrical, trachea midline and thyroid not enlarged, symmetric, no tenderness/mass/nodules Lymph nodes: Cervical, supraclavicular, and axillary nodes normal. Heart:regular rate and rhythm, S1, S2 normal, no murmur, click, rub or gallop Lung:chest clear, no wheezing, rales, normal symmetric air entry Abdomin: soft, non-tender, without masses or organomegaly EXT:no erythema, induration, or nodules   Lab Results: Lab Results  Component Value Date   WBC 9.0 12/04/2012   HGB 10.7* 12/04/2012   HCT 32.2* 12/04/2012   MCV 92.5 12/04/2012   PLT 199 12/04/2012     Chemistry      Component Value Date/Time   NA 144 12/04/2012 1513   NA 138 08/12/2012 0514   K 3.9 12/04/2012 1513   K 3.0* 08/12/2012 0514   CL 101 08/24/2012 1402   CL 105 08/12/2012 0514   CO2 28 12/04/2012 1513   CO2 23 08/12/2012 0514   BUN 26.6* 12/04/2012 1513   BUN 24* 08/12/2012 0514   CREATININE 1.8* 12/04/2012 1513   CREATININE 1.12 08/12/2012  1610      Component Value Date/Time   CALCIUM 9.3 12/04/2012 1513   CALCIUM 8.6 08/12/2012 0514   ALKPHOS 49 12/04/2012 1513   AST 16 12/04/2012 1513   ALT 13 12/04/2012 1513   BILITOT 0.37 12/04/2012 1513       Impression and Plan:  79 year old gentleman with the following  issues:  1. Normocytic, normochromic anemia most likely from multifactorial etiology. He has anemia of chronic disease as well as anemia of renal disease. His hemoglobin today is actually improved from 3 months ago was up to 10.7. He's asymptomatic from his disease really does not require any treatment. I discussed with him the role of growth factor support in the form of erythropoietin replacement but really there is no need for this intervention at this time. I think he will be an excellent candidate for it in the future should his hemoglobin drifts down below 10. I will evaluate him back in 6 months time.   2. Renal insufficiency: His creatinine is around 1.8 with a clearance of less than 60 cc per minute. This is probably contributing to his anemia but remains stable at this time.  [facility], MD 7/15/20143:51 PM

## 2012-12-26 ENCOUNTER — Other Ambulatory Visit: Payer: Self-pay

## 2013-03-28 ENCOUNTER — Other Ambulatory Visit: Payer: Self-pay

## 2013-05-06 ENCOUNTER — Telehealth: Payer: Self-pay | Admitting: *Deleted

## 2013-05-06 NOTE — Telephone Encounter (Signed)
sw pt wife informed her that [facility] will be on call 06/04/13. gv appt for 06/19/13 w/labs@ 8:30am and ov@ 9am. Pt wife is aware...td

## 2013-06-04 ENCOUNTER — Ambulatory Visit: Payer: Medicare Other | Admitting: Oncology

## 2013-06-04 ENCOUNTER — Other Ambulatory Visit: Payer: Medicare Other

## 2013-06-19 ENCOUNTER — Ambulatory Visit (HOSPITAL_BASED_OUTPATIENT_CLINIC_OR_DEPARTMENT_OTHER): Payer: Medicare Other | Admitting: Oncology

## 2013-06-19 ENCOUNTER — Encounter: Payer: Self-pay | Admitting: Oncology

## 2013-06-19 ENCOUNTER — Other Ambulatory Visit (HOSPITAL_BASED_OUTPATIENT_CLINIC_OR_DEPARTMENT_OTHER): Payer: Medicare Other

## 2013-06-19 VITALS — BP 132/68 | HR 65 | Temp 97.4°F | Resp 18 | Ht 71.0 in | Wt 170.1 lb

## 2013-06-19 DIAGNOSIS — D638 Anemia in other chronic diseases classified elsewhere: Secondary | ICD-10-CM

## 2013-06-19 DIAGNOSIS — D649 Anemia, unspecified: Secondary | ICD-10-CM

## 2013-06-19 DIAGNOSIS — N289 Disorder of kidney and ureter, unspecified: Secondary | ICD-10-CM

## 2013-06-19 DIAGNOSIS — D539 Nutritional anemia, unspecified: Secondary | ICD-10-CM

## 2013-06-19 LAB — CBC WITH DIFFERENTIAL/PLATELET
BASO%: 0.9 % (ref 0.0–2.0)
Basophils Absolute: 0.1 10*3/uL (ref 0.0–0.1)
EOS ABS: 0.7 10*3/uL — AB (ref 0.0–0.5)
EOS%: 8.3 % — ABNORMAL HIGH (ref 0.0–7.0)
HCT: 38.2 % — ABNORMAL LOW (ref 38.4–49.9)
HGB: 12.6 g/dL — ABNORMAL LOW (ref 13.0–17.1)
LYMPH#: 2.1 10*3/uL (ref 0.9–3.3)
LYMPH%: 25.9 % (ref 14.0–49.0)
MCH: 31.6 pg (ref 27.2–33.4)
MCHC: 33.1 g/dL (ref 32.0–36.0)
MCV: 95.5 fL (ref 79.3–98.0)
MONO#: 0.7 10*3/uL (ref 0.1–0.9)
MONO%: 8 % (ref 0.0–14.0)
NEUT%: 56.9 % (ref 39.0–75.0)
NEUTROS ABS: 4.7 10*3/uL (ref 1.5–6.5)
Platelets: 178 10*3/uL (ref 140–400)
RBC: 4 10*6/uL — AB (ref 4.20–5.82)
RDW: 15.1 % — AB (ref 11.0–14.6)
WBC: 8.2 10*3/uL (ref 4.0–10.3)

## 2013-06-19 LAB — COMPREHENSIVE METABOLIC PANEL (CC13)
ALBUMIN: 3.9 g/dL (ref 3.5–5.0)
ALT: 13 U/L (ref 0–55)
ANION GAP: 12 meq/L — AB (ref 3–11)
AST: 15 U/L (ref 5–34)
Alkaline Phosphatase: 54 U/L (ref 40–150)
BUN: 29.5 mg/dL — ABNORMAL HIGH (ref 7.0–26.0)
CO2: 29 meq/L (ref 22–29)
Calcium: 9.7 mg/dL (ref 8.4–10.4)
Chloride: 104 mEq/L (ref 98–109)
Creatinine: 2 mg/dL — ABNORMAL HIGH (ref 0.7–1.3)
GLUCOSE: 131 mg/dL (ref 70–140)
POTASSIUM: 3.7 meq/L (ref 3.5–5.1)
SODIUM: 145 meq/L (ref 136–145)
TOTAL PROTEIN: 6.8 g/dL (ref 6.4–8.3)
Total Bilirubin: 0.37 mg/dL (ref 0.20–1.20)

## 2013-06-19 NOTE — Progress Notes (Signed)
Hematology and Oncology Follow Up Visit  Darren Brennan [id number] 1932-07-13 80 y.o. 06/19/2013 9:09 AM Darren Brennan, MDElkins, Darren Brennan, *   Principle Diagnosis: 80 year old gentleman with multifactorial anemia with combination of anemia of chronic disease and anemia of renal insufficiency. He could have early myelodysplastic syndrome.   Current therapy: Observation and surveillance.  Interim History:  Darren Brennan is as today for a followup visit. He is a pleasant 80 year old gentleman I saw for the first time back in April of 2014 for the evaluation of normocytic normochromic anemia. His workup was unrevealing at that time. His serum protein electrophoresis was normal. His erythropoietin was inappropriately normal indicating possibly anemia of renal disease. Since his last visit, he has been asymptomatic. His energy, performance status and activity level limited but continues to be at baseline. His continued to able to drive and that test is activity of daily living. He has not had any hospitalizations or illnesses.  Medications: I have reviewed the patient's current medications.  Current Outpatient Prescriptions  Medication Sig Dispense Refill  . allopurinol (ZYLOPRIM) 100 MG tablet Take 200 mg by mouth daily.      . colchicine (COLCRYS) 0.6 MG tablet Take 0.6 mg by mouth daily.      Marland Kitchen ENSURE (ENSURE) Take 237 mLs by mouth daily as needed.      . finasteride (PROSCAR) 5 MG tablet Take 5 mg by mouth daily.      . furosemide (LASIX) 40 MG tablet Take 40 mg by mouth daily.      Marland Kitchen HYDROcodone-acetaminophen (NORCO/VICODIN) 5-325 MG per tablet Take 1 tablet by mouth every 6 (six) hours as needed (pain).      Marland Kitchen lisinopril (PRINIVIL,ZESTRIL) 40 MG tablet Take 40 mg by mouth daily.      . metoprolol (LOPRESSOR) 50 MG tablet Take 25 mg by mouth daily.      . tamsulosin (FLOMAX) 0.4 MG CAPS Take 0.4 mg by mouth daily.       No current facility-administered medications for this visit.      Allergies: No Known Allergies  Past Medical History, Surgical history, Social history, and Family History were reviewed and updated.  Review of Systems: Constitutional:  Negative for fever, chills, night sweats, anorexia, weight loss, pain.  Remaining ROS negative. Physical Exam: Blood pressure 132/68, pulse 65, temperature 97.4 F (36.3 C), temperature source Oral, resp. rate 18, height 5\' 11"  (1.803 m), weight 170 lb 1.6 oz (77.157 kg), SpO2 97.00%. ECOG: 1 General appearance: alert Head: Normocephalic, without obvious abnormality, atraumatic Neck: no adenopathy, no carotid bruit, no JVD, supple, symmetrical, trachea midline and thyroid not enlarged, symmetric, no tenderness/mass/nodules Lymph nodes: Cervical, supraclavicular, and axillary nodes normal. Heart:regular rate and rhythm, S1, S2 normal, no murmur, click, rub or gallop Lung:chest clear, no wheezing, rales, normal symmetric air entry Abdomin: soft, non-tender, without masses or organomegaly EXT:no erythema, induration, or nodules   Lab Results: Lab Results  Component Value Date   WBC 8.2 06/19/2013   HGB 12.6* 06/19/2013   HCT 38.2* 06/19/2013   MCV 95.5 06/19/2013   PLT 178 06/19/2013     Chemistry      Component Value Date/Time   NA 144 12/04/2012 1513   NA 138 08/12/2012 0514   K 3.9 12/04/2012 1513   K 3.0* 08/12/2012 0514   CL 101 08/24/2012 1402   CL 105 08/12/2012 0514   CO2 28 12/04/2012 1513   CO2 23 08/12/2012 0514   BUN 26.6* 12/04/2012 1513  BUN 24* 08/12/2012 0514   CREATININE 1.8* 12/04/2012 1513   CREATININE 1.12 08/12/2012 0514      Component Value Date/Time   CALCIUM 9.3 12/04/2012 1513   CALCIUM 8.6 08/12/2012 0514   ALKPHOS 49 12/04/2012 1513   AST 16 12/04/2012 1513   ALT 13 12/04/2012 1513   BILITOT 0.37 12/04/2012 1513       Impression and Plan:  80 year old gentleman with the following issues:  1. Normocytic, normochromic anemia most likely from multifactorial etiology. He has anemia  of chronic disease as well as anemia of renal disease. His hemoglobin today is back to normal. He's asymptomatic from his disease really does not require any treatment. I discussed with him the role of growth factor support in the form of erythropoietin replacement but really there is no need for this intervention at this time. I think he will be an excellent candidate for it in the future should his hemoglobin drifts down below 10. For the time being, I see no further need for any workup I will be happy to see him in the future as needed.  2. Renal insufficiency: His creatinine is around 1.8 with a clearance of less than 60 cc per minute. This is probably contributing to his anemia but remains stable at this time.  [facility], MD 1/28/20159:09 AM

## 2014-03-18 ENCOUNTER — Other Ambulatory Visit: Payer: Self-pay | Admitting: Family Medicine

## 2014-03-18 DIAGNOSIS — R1310 Dysphagia, unspecified: Secondary | ICD-10-CM

## 2014-03-20 ENCOUNTER — Ambulatory Visit
Admission: RE | Admit: 2014-03-20 | Discharge: 2014-03-20 | Disposition: A | Discharge status: Home / Self Care | Payer: Medicare Other | Source: Ambulatory Visit | Attending: Family Medicine | Admitting: Family Medicine

## 2014-03-20 DIAGNOSIS — R1310 Dysphagia, unspecified: Secondary | ICD-10-CM

## 2014-04-04 ENCOUNTER — Ambulatory Visit
Admission: RE | Admit: 2014-04-04 | Discharge: 2014-04-04 | Disposition: A | Discharge status: Home / Self Care | Payer: Medicare Other | Source: Ambulatory Visit | Attending: Cardiology | Admitting: Cardiology

## 2014-04-04 ENCOUNTER — Other Ambulatory Visit: Payer: Self-pay | Admitting: Cardiology

## 2014-04-04 DIAGNOSIS — R0600 Dyspnea, unspecified: Secondary | ICD-10-CM

## 2014-12-02 ENCOUNTER — Other Ambulatory Visit: Payer: Self-pay | Admitting: Family Medicine

## 2014-12-02 ENCOUNTER — Emergency Department (HOSPITAL_COMMUNITY)
Admission: EM | Admit: 2014-12-02 | Discharge: 2014-12-02 | Disposition: A | Discharge status: Home / Self Care | Payer: Medicare Other | Attending: Emergency Medicine | Admitting: Emergency Medicine

## 2014-12-02 ENCOUNTER — Encounter (HOSPITAL_COMMUNITY): Payer: Self-pay | Admitting: Emergency Medicine

## 2014-12-02 ENCOUNTER — Emergency Department (HOSPITAL_COMMUNITY): Payer: Medicare Other

## 2014-12-02 DIAGNOSIS — N19 Unspecified kidney failure: Secondary | ICD-10-CM

## 2014-12-02 DIAGNOSIS — I10 Essential (primary) hypertension: Secondary | ICD-10-CM | POA: Insufficient documentation

## 2014-12-02 DIAGNOSIS — E119 Type 2 diabetes mellitus without complications: Secondary | ICD-10-CM | POA: Diagnosis not present

## 2014-12-02 DIAGNOSIS — N2889 Other specified disorders of kidney and ureter: Secondary | ICD-10-CM

## 2014-12-02 DIAGNOSIS — Z79899 Other long term (current) drug therapy: Secondary | ICD-10-CM | POA: Diagnosis not present

## 2014-12-02 DIAGNOSIS — N289 Disorder of kidney and ureter, unspecified: Secondary | ICD-10-CM | POA: Insufficient documentation

## 2014-12-02 DIAGNOSIS — I509 Heart failure, unspecified: Secondary | ICD-10-CM | POA: Diagnosis not present

## 2014-12-02 DIAGNOSIS — R1032 Left lower quadrant pain: Secondary | ICD-10-CM | POA: Diagnosis present

## 2014-12-02 DIAGNOSIS — M109 Gout, unspecified: Secondary | ICD-10-CM | POA: Diagnosis not present

## 2014-12-02 DIAGNOSIS — R1900 Intra-abdominal and pelvic swelling, mass and lump, unspecified site: Secondary | ICD-10-CM

## 2014-12-02 LAB — URINE MICROSCOPIC-ADD ON

## 2014-12-02 LAB — COMPREHENSIVE METABOLIC PANEL
ALBUMIN: 2.7 g/dL — AB (ref 3.5–5.0)
ALK PHOS: 177 U/L — AB (ref 38–126)
ALT: 20 U/L (ref 17–63)
AST: 44 U/L — ABNORMAL HIGH (ref 15–41)
Anion gap: 11 (ref 5–15)
BUN: 43 mg/dL — ABNORMAL HIGH (ref 6–20)
CHLORIDE: 101 mmol/L (ref 101–111)
CO2: 27 mmol/L (ref 22–32)
Calcium: 8.5 mg/dL — ABNORMAL LOW (ref 8.9–10.3)
Creatinine, Ser: 2.11 mg/dL — ABNORMAL HIGH (ref 0.61–1.24)
GFR calc non Af Amer: 28 mL/min — ABNORMAL LOW (ref >60)
GFR, EST AFRICAN AMERICAN: 33 mL/min — AB (ref >60)
Glucose, Bld: 172 mg/dL — ABNORMAL HIGH (ref 65–99)
Potassium: 3.3 mmol/L — ABNORMAL LOW (ref 3.5–5.1)
SODIUM: 139 mmol/L (ref 135–145)
Total Bilirubin: 0.6 mg/dL (ref 0.3–1.2)
Total Protein: 6.4 g/dL — ABNORMAL LOW (ref 6.5–8.1)

## 2014-12-02 LAB — URINALYSIS, ROUTINE W REFLEX MICROSCOPIC
Bilirubin Urine: NEGATIVE
GLUCOSE, UA: NEGATIVE mg/dL
HGB URINE DIPSTICK: NEGATIVE
Ketones, ur: NEGATIVE mg/dL
LEUKOCYTES UA: NEGATIVE
Nitrite: NEGATIVE
PROTEIN: 30 mg/dL — AB
SPECIFIC GRAVITY, URINE: 1.014 (ref 1.005–1.030)
UROBILINOGEN UA: 0.2 mg/dL (ref 0.0–1.0)
pH: 6 (ref 5.0–8.0)

## 2014-12-02 LAB — CBC
HCT: 33.8 % — ABNORMAL LOW (ref 39.0–52.0)
HEMOGLOBIN: 10.6 g/dL — AB (ref 13.0–17.0)
MCH: 31.1 pg (ref 26.0–34.0)
MCHC: 31.4 g/dL (ref 30.0–36.0)
MCV: 99.1 fL (ref 78.0–100.0)
PLATELETS: 349 10*3/uL (ref 150–400)
RBC: 3.41 MIL/uL — ABNORMAL LOW (ref 4.22–5.81)
RDW: 15.2 % (ref 11.5–15.5)
WBC: 14 10*3/uL — ABNORMAL HIGH (ref 4.0–10.5)

## 2014-12-02 LAB — LIPASE, BLOOD: LIPASE: 34 U/L (ref 22–51)

## 2014-12-02 NOTE — ED Notes (Signed)
Awake. Verbally responsive. Resp even and unlabored. No audible adventitious breath sounds noted. ABC's intact. Abd soft/nondistended but tender to palpate to LLQ. BS (+) and active x4 quadrants. No N/V/D reported. LBM x3 days ago

## 2014-12-02 NOTE — Discharge Instructions (Signed)

## 2014-12-02 NOTE — ED Notes (Signed)
Awake. Verbally responsive. Resp even and unlabored. No audible adventitious breath sounds noted. ABC's intact. No N/V/D reported. IV saline lock patent and intact.

## 2014-12-02 NOTE — ED Notes (Signed)
Awake. Verbally responsive. Resp even and unlabored. No audible adventitious breath sounds noted. ABC's intact. No N/V/D reported. IV SL patent and intact. Family at bedside.

## 2014-12-02 NOTE — ED Notes (Signed)
Waiting on urology consult, pt wanting to eat, will keep pt updated

## 2014-12-02 NOTE — ED Notes (Signed)
MD at bedside. 

## 2014-12-02 NOTE — ED Notes (Addendum)
Awake. Verbally responsive. Resp even and unlabored. No audible adventitious breath sounds noted. ABC's intact. Abd soft/nondistended but tender to palpate. BS (+) and active x4 quadrants. No N/V/D reported. IV saline lock patent and intact. 

## 2014-12-02 NOTE — ED Notes (Signed)
Pt c/o LLQ pain with diarrhea (has been taking a laxative) x 2 wks.  States that he has been very gassy (burping). Denies vomiting.

## 2014-12-02 NOTE — ED Provider Notes (Addendum)
CSN: [id number]     Arrival date & time 12/02/14  1352 History   First MD Initiated Contact with Patient 12/02/14 1459     Chief Complaint  Patient presents with  . Abdominal Pain     (Consider location/radiation/quality/duration/timing/severity/associated sxs/prior Treatment) HPI Comments: Patient referred to the emergency department by his primary doctor for evaluation of abdominal pain. Patient has been experiencing pain in the left lower portion of his abdomen for 2 weeks. This is a sharp and stabbing pain. He also has a constant burning pain in the lower suprapubic region. He has not had any close vomiting, has had nausea and "spitting up phlegm". He has had diarrhea as well. There has not been any hematemesis, melanoma or hematochezia. Patient denies fever. No chest pain or shortness of breath.  Patient is a 81 y.o. male presenting with abdominal pain.  Abdominal Pain Associated symptoms: diarrhea     Past Medical History  Diagnosis Date  . Hypertension   . Diabetes mellitus without complication   . Gout   . Renal disorder   . CHF (congestive heart failure)    No past surgical history on file. No family history on file. History  Substance Use Topics  . Smoking status: Never Smoker   . Smokeless tobacco: Not on file  . Alcohol Use: No    Review of Systems  Gastrointestinal: Positive for abdominal pain and diarrhea.  All other systems reviewed and are negative.     Allergies  Review of patient's allergies indicates no known allergies.  Home Medications   Prior to Admission medications   Medication Sig Start Date End Date Taking? Authorizing Provider  acetaminophen (TYLENOL) 500 MG tablet Take 500 mg by mouth every 12 (twelve) hours as needed for mild pain, moderate pain or headache.   Yes Historical Provider, MD  allopurinol (ZYLOPRIM) 100 MG tablet Take 100 mg by mouth daily.    Yes Historical Provider, MD  Ca Carbonate-Mag Hydroxide (ROLAIDS PO) Take 1 tablet  by mouth daily as needed (indigestion, heartburn).   Yes Historical Provider, MD  ENSURE (ENSURE) Take 237 mLs by mouth daily as needed (appetitte).    Yes Historical Provider, MD  finasteride (PROSCAR) 5 MG tablet Take 5 mg by mouth daily.   Yes Historical Provider, MD  fluticasone (FLONASE) 50 MCG/ACT nasal spray Place 2 sprays into both nostrils daily as needed for allergies.  10/31/14  Yes Historical Provider, MD  furosemide (LASIX) 40 MG tablet Take 40 mg by mouth daily.   Yes Historical Provider, MD  metoprolol (LOPRESSOR) 50 MG tablet Take 25 mg by mouth daily.   Yes Historical Provider, MD  pantoprazole (PROTONIX) 40 MG tablet Take 40 mg by mouth daily. 11/10/14  Yes Historical Provider, MD  simethicone (MYLICON) 628 MG chewable tablet Chew 125 mg by mouth every 6 (six) hours as needed for flatulence.   Yes Historical Provider, MD  tamsulosin (FLOMAX) 0.4 MG CAPS Take 0.4 mg by mouth daily.   Yes Historical Provider, MD  traMADol (ULTRAM) 50 MG tablet Take 50-100 mg by mouth every 4 (four) hours as needed for moderate pain or severe pain.  11/26/14  Yes Historical Provider, MD  lisinopril (PRINIVIL,ZESTRIL) 40 MG tablet Take 40 mg by mouth daily.    Historical Provider, MD   BP 148/80 mmHg  Pulse 81  Temp(Src) 98.3 F (36.8 C) (Oral)  Resp 20  SpO2 95% Physical Exam  Constitutional: He is oriented to person, place, and time. He appears well-developed  and well-nourished. No distress.  HENT:  Head: Normocephalic and atraumatic.  Right Ear: Hearing normal.  Left Ear: Hearing normal.  Nose: Nose normal.  Mouth/Throat: Oropharynx is clear and moist and mucous membranes are normal.  Eyes: Conjunctivae and EOM are normal. Pupils are equal, round, and reactive to light.  Neck: Normal range of motion. Neck supple.  Cardiovascular: Regular rhythm, S1 normal and S2 normal.  Exam reveals no gallop and no friction rub.   No murmur heard. Pulmonary/Chest: Effort normal and breath sounds normal.  No respiratory distress. He exhibits no tenderness.  Abdominal: Soft. Normal appearance and bowel sounds are normal. There is no hepatosplenomegaly. There is tenderness in the suprapubic area and left lower quadrant. There is no rebound, no guarding, no tenderness at McBurney's point and negative Murphy's sign. No hernia.  Musculoskeletal: Normal range of motion.  Neurological: He is alert and oriented to person, place, and time. He has normal strength. No cranial nerve deficit or sensory deficit. Coordination normal. GCS eye subscore is 4. GCS verbal subscore is 5. GCS motor subscore is 6.  Skin: Skin is warm, dry and intact. No rash noted. No cyanosis.  Psychiatric: He has a normal mood and affect. His speech is normal and behavior is normal. Thought content normal.  Nursing note and vitals reviewed.   ED Course  Procedures (including critical care time) Labs Review Labs Reviewed  COMPREHENSIVE METABOLIC PANEL - Abnormal; Notable for the following:    Potassium 3.3 (*)    Glucose, Bld 172 (*)    BUN 43 (*)    Creatinine, Ser 2.11 (*)    Calcium 8.5 (*)    Total Protein 6.4 (*)    Albumin 2.7 (*)    AST 44 (*)    Alkaline Phosphatase 177 (*)    GFR calc non Af Amer 28 (*)    GFR calc Af Amer 33 (*)    All other components within normal limits  CBC - Abnormal; Notable for the following:    WBC 14.0 (*)    RBC 3.41 (*)    Hemoglobin 10.6 (*)    HCT 33.8 (*)    All other components within normal limits  URINALYSIS, ROUTINE W REFLEX MICROSCOPIC (NOT AT [facility]) - Abnormal; Notable for the following:    APPearance CLOUDY (*)    Protein, ur 30 (*)    All other components within normal limits  URINE MICROSCOPIC-ADD ON - Abnormal; Notable for the following:    Squamous Epithelial / LPF FEW (*)    All other components within normal limits  LIPASE, BLOOD    Imaging Review Ct Abdomen Pelvis Wo Contrast  12/02/2014   CLINICAL DATA:  81 year old male with left lower quadrant abdominal  pain  EXAM: CT ABDOMEN AND PELVIS WITHOUT CONTRAST  TECHNIQUE: Multidetector CT imaging of the abdomen and pelvis was performed following the standard protocol without IV contrast.  COMPARISON:  None.  FINDINGS: Evaluation of this exam is limited in the absence of intravenous contrast.  There are innumerable bilateral pulmonary nodules measuring up to 1.5 cm compatible with metastatic disease. There is focal subsegmental consolidation at the right lung base compatible with atelectasis/pneumonia. Top-normal cardiac size. There is coronary vascular calcification.  No intra-abdominal free air.  Small subhepatic free fluid.  There is the large right suprarenal mass measuring 10 x 14 cm in greatest axial dimension and up to 16 cm in craniocaudal length. This mass demonstrates areas of central necrosis. There is loss of fat plane  between this lesion and superior pole of the kidney. There is apparent invasion of the mass into the right lobe of the liver. This mass may arise from the right kidney or right adrenal gland and is most compatible with malignancy. MRI without and with contrast is recommended for further characterization with bold there is a 2.0 x 1.8 cm hypodense lesion in the right lobe of the liver (series 2, image 33) which is not well characterized but may represent metastatic disease. A 1.8 by 1.8 cm hypodense lesion in the right hepatic lobe inferiorly is also not well characterized but may represent cyst. The gallbladder, pancreas, spleen, appear unremarkable. There is a 1.7 cm left adrenal nodule. The right adrenal gland is not visualized. There is moderate left renal atrophy. There is no hydronephrosis or nephrolithiasis on the left. Stable the left ureter appears unremarkable. There is severe right hydronephrosis with a transition zone in the right ureteropelvic junction. No calculus identified. The right UPJ stricture is not excluded. There is apparent rotation of the bladder wall, likely related to  chronic bladder outlet obstruction. The prostate gland is grossly unremarkable with moderate stool noted throughout the colon. There are scattered colonic diverticula without acute inflammatory changes. There is no evidence of bowel obstruction. The appendix appears unremarkable.  There is aortoiliac atherosclerotic disease. There is no retroperitoneal, para-aortic, aortocaval adenopathy. There is a 11 x 9 cm reason streak mass in the left hemiabdomen in compatible with metastatic disease. There are innumerable omental nodules compatible with metastatic implant. Degenerative changes of the spine. No acute fracture. Check Dr. A 1 cm lucent focus in the left iliac bone lateral to the SI joints (series 2, image 66) is not well characterized but may represent a focal osteopenic area. Lytic metastatic disease is not excluded.  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 This mass may arise from the right kidney, or the right adrenal gland with possible invasion into the liver.  Large soft tissue mass in the left hemiabdomen compatible with metastatic disease. There is extensive omental implants.  Retroperitoneal adenopathy.  Innumerable pulmonary metastatic disease.  No evidence of bowel obstruction.  Severe right hydronephrosis with transition zone at the right UPJ. No stone identified.   Electronically Signed   By: Anner Crete M.D.   On: 12/02/2014 19:02     EKG Interpretation None      MDM   Final diagnoses:  LLQ pain  Renal mass  suprarenal mass  Patient presents to the emergency department for evaluation of left lower abdominal pain. Patient has been expressing pain for 2 weeks. He has had some mild associated diarrhea. He was referred to the ER for further evaluation. Examination was fairly unremarkable. He does have some mild tenderness in the left lower quadrant, but no guarding or rebound. Lab work was unremarkable other than renal insufficiency which is chronic.  CT scan was performed. This shows evidence of large right suprarenal mass. He also has evidence of right hydroureter and hydronephrosis.  Patient will require further workup for etiology of this mass. This can be performed as an outpatient. Based on his mild worsening of renal insufficiency and the hydronephrosis, however, discussed with Dr. Tresa Moore, on call for urology. He did not feel that the patient would benefit from emergent intervention such as ureteral stenting. Patient will therefore be discharged, follow-up with his primary doctor for referral for biopsy and further management of mass.    Orpah Greek, MD 12/02/14 2028  Gwenyth Allegra  Betsey Holiday, MD 12/02/14 2030

## 2014-12-02 NOTE — ED Notes (Signed)
Patient transported to CT 

## 2014-12-03 ENCOUNTER — Other Ambulatory Visit: Payer: Self-pay

## 2014-12-04 ENCOUNTER — Other Ambulatory Visit (HOSPITAL_COMMUNITY): Payer: Self-pay | Admitting: Family Medicine

## 2014-12-04 DIAGNOSIS — R599 Enlarged lymph nodes, unspecified: Secondary | ICD-10-CM

## 2014-12-04 DIAGNOSIS — R591 Generalized enlarged lymph nodes: Secondary | ICD-10-CM

## 2014-12-09 ENCOUNTER — Other Ambulatory Visit: Payer: Self-pay | Admitting: Physician Assistant

## 2014-12-10 ENCOUNTER — Ambulatory Visit (HOSPITAL_COMMUNITY)
Admission: RE | Admit: 2014-12-10 | Discharge: 2014-12-10 | Disposition: A | Discharge status: Home / Self Care | Payer: Medicare Other | Source: Ambulatory Visit | Attending: Family Medicine | Admitting: Family Medicine

## 2014-12-10 ENCOUNTER — Encounter (HOSPITAL_COMMUNITY): Payer: Self-pay

## 2014-12-10 DIAGNOSIS — R599 Enlarged lymph nodes, unspecified: Secondary | ICD-10-CM

## 2014-12-10 DIAGNOSIS — R591 Generalized enlarged lymph nodes: Secondary | ICD-10-CM | POA: Diagnosis present

## 2014-12-10 LAB — APTT: aPTT: 30 seconds (ref 24–37)

## 2014-12-10 LAB — CBC
HCT: 31.5 % — ABNORMAL LOW (ref 39.0–52.0)
Hemoglobin: 10 g/dL — ABNORMAL LOW (ref 13.0–17.0)
MCH: 30.5 pg (ref 26.0–34.0)
MCHC: 31.7 g/dL (ref 30.0–36.0)
MCV: 96 fL (ref 78.0–100.0)
PLATELETS: 340 10*3/uL (ref 150–400)
RBC: 3.28 MIL/uL — ABNORMAL LOW (ref 4.22–5.81)
RDW: 14.9 % (ref 11.5–15.5)
WBC: 17.5 10*3/uL — ABNORMAL HIGH (ref 4.0–10.5)

## 2014-12-10 LAB — PROTIME-INR
INR: 1.13 (ref 0.00–1.49)
Prothrombin Time: 14.7 seconds (ref 11.6–15.2)

## 2014-12-10 MED ORDER — MIDAZOLAM HCL 2 MG/2ML IJ SOLN
INTRAMUSCULAR | Status: AC | PRN
Start: 2014-12-10 — End: 2014-12-10
  Administered 2014-12-10: 1 mg via INTRAVENOUS

## 2014-12-10 MED ORDER — SODIUM CHLORIDE 0.9 % IV SOLN: INTRAVENOUS | Status: DC

## 2014-12-10 MED ORDER — MIDAZOLAM HCL 2 MG/2ML IJ SOLN
INTRAMUSCULAR | Status: AC
  Filled 2014-12-10: qty 2

## 2014-12-10 MED ORDER — LIDOCAINE HCL 1 % IJ SOLN
INTRAMUSCULAR | Status: AC
  Filled 2014-12-10: qty 20

## 2014-12-10 MED ORDER — FENTANYL CITRATE (PF) 100 MCG/2ML IJ SOLN
INTRAMUSCULAR | Status: AC
  Filled 2014-12-10: qty 2

## 2014-12-10 MED ORDER — FENTANYL CITRATE (PF) 100 MCG/2ML IJ SOLN
INTRAMUSCULAR | Status: AC | PRN
  Administered 2014-12-10: 50 ug via INTRAVENOUS

## 2014-12-10 NOTE — Discharge Instructions (Signed)

## 2014-12-10 NOTE — H&P (Signed)
Chief Complaint: Patient was seen in consultation today for abdominal pain at the request of Leonard Downing  Referring Physician(s): Leonard Downing  History of Present Illness: Darren Brennan is a 81 y.o. male   In normal state of health until 12/02/14 Complained of abd pain and continued to worsen CT was performed:  IMPRESSION: Large right suprarenal mass most compatible with malignancy. This mass may arise from the right kidney, or the right adrenal gland with possible invasion into the liver.  Large soft tissue mass in the left hemiabdomen compatible with metastatic disease. There is extensive omental implants.  Retroperitoneal adenopathy.  Innumerable pulmonary metastatic disease.  Now scheduled for left lower quadrant mass biopsy per Dr Pascal Lux and Dr Barbie Banner review.  Past Medical History  Diagnosis Date  . Hypertension   . Diabetes mellitus without complication   . Gout   . Renal disorder   . CHF (congestive heart failure)     History reviewed. No pertinent past surgical history.  Allergies: Review of patient's allergies indicates no known allergies.  Medications: Prior to Admission medications   Medication Sig Start Date End Date Taking? Authorizing Provider  Ca Carbonate-Mag Hydroxide (ROLAIDS PO) Take 1 tablet by mouth daily as needed (indigestion, heartburn).   Yes Historical Provider, MD  ENSURE (ENSURE) Take 237 mLs by mouth daily as needed (appetitte).    Yes Historical Provider, MD  fluticasone (FLONASE) 50 MCG/ACT nasal spray Place 2 sprays into both nostrils daily as needed for allergies.  10/31/14  Yes Historical Provider, MD  simethicone (MYLICON) 144 MG chewable tablet Chew 125 mg by mouth every 6 (six) hours as needed for flatulence.   Yes Historical Provider, MD  traMADol (ULTRAM) 50 MG tablet Take 50-100 mg by mouth every 4 (four) hours as needed for moderate pain or severe pain.  11/26/14  Yes Historical Provider, MD  acetaminophen  (TYLENOL) 500 MG tablet Take 500 mg by mouth every 12 (twelve) hours as needed for mild pain, moderate pain or headache.    Historical Provider, MD  allopurinol (ZYLOPRIM) 100 MG tablet Take 100 mg by mouth daily.     Historical Provider, MD  finasteride (PROSCAR) 5 MG tablet Take 5 mg by mouth daily.    Historical Provider, MD  furosemide (LASIX) 40 MG tablet Take 40 mg by mouth daily.    Historical Provider, MD  metoprolol (LOPRESSOR) 50 MG tablet Take 25 mg by mouth daily.    Historical Provider, MD  pantoprazole (PROTONIX) 40 MG tablet Take 40 mg by mouth daily. 11/10/14   Historical Provider, MD  tamsulosin (FLOMAX) 0.4 MG CAPS Take 0.4 mg by mouth daily.    Historical Provider, MD     History reviewed. No pertinent family history.  History   Social History  . Marital Status: Married    Spouse Name: N/A  . Number of Children: N/A  . Years of Education: N/A   Social History Main Topics  . Smoking status: Never Smoker   . Smokeless tobacco: Not on file  . Alcohol Use: No  . Drug Use: No  . Sexual Activity: Not on file   Other Topics Concern  . None   Social History Narrative    Review of Systems: A 12 point ROS discussed and pertinent positives are indicated in the HPI above.  All other systems are negative.  Review of Systems  Constitutional: Positive for activity change, appetite change, fatigue and unexpected weight change. Negative for fever.  Respiratory: Positive for  shortness of breath.   Cardiovascular: Negative for chest pain.  Gastrointestinal: Positive for abdominal pain and abdominal distention.  Musculoskeletal: Positive for back pain.  Neurological: Positive for weakness.  Psychiatric/Behavioral: Positive for confusion. Negative for behavioral problems and agitation.    Vital Signs: BP 136/77 mmHg  Pulse 109  Temp(Src) 97.4 F (36.3 C)  Resp 18  SpO2 94%  Physical Exam  Cardiovascular: Normal rate and regular rhythm.   No murmur  heard. Pulmonary/Chest: Effort normal and breath sounds normal. He has no wheezes.  Abdominal: Bowel sounds are normal. He exhibits distension. There is tenderness.  Musculoskeletal: Normal range of motion.  Neurological: He is alert.  Skin: Skin is warm and dry.  Psychiatric: He has a normal mood and affect. His behavior is normal.  Consented with wife Pt mildly confused  Nursing note and vitals reviewed.   Mallampati Score:  MD Evaluation Airway: WNL Heart: WNL Abdomen: WNL Chest/ Lungs: WNL ASA  Classification: 3 Mallampati/Airway Score: Two  Imaging: Ct Abdomen Pelvis Wo Contrast  12/02/2014   CLINICAL DATA:  81 year old male with left lower quadrant abdominal pain  EXAM: CT ABDOMEN AND PELVIS WITHOUT CONTRAST  TECHNIQUE: Multidetector CT imaging of the abdomen and pelvis was performed following the standard protocol without IV contrast.  COMPARISON:  None.  FINDINGS: Evaluation of this exam is limited in the absence of intravenous contrast.  There are innumerable bilateral pulmonary nodules measuring up to 1.5 cm compatible with metastatic disease. There is focal subsegmental consolidation at the right lung base compatible with atelectasis/pneumonia. Top-normal cardiac size. There is coronary vascular calcification.  No intra-abdominal free air.  Small subhepatic free fluid.  There is the large right suprarenal mass measuring 10 x 14 cm in greatest axial dimension and up to 16 cm in craniocaudal length. This mass demonstrates areas of central necrosis. There is loss of fat plane between this lesion and superior pole of the kidney. There is apparent invasion of the mass into the right lobe of the liver. This mass may arise from the right kidney or right adrenal gland and is most compatible with malignancy. MRI without and with contrast is recommended for further characterization with bold there is a 2.0 x 1.8 cm hypodense lesion in the right lobe of the liver (series 2, image 33) which is  not well characterized but may represent metastatic disease. A 1.8 by 1.8 cm hypodense lesion in the right hepatic lobe inferiorly is also not well characterized but may represent cyst. The gallbladder, pancreas, spleen, appear unremarkable. There is a 1.7 cm left adrenal nodule. The right adrenal gland is not visualized. There is moderate left renal atrophy. There is no hydronephrosis or nephrolithiasis on the left. Stable the left ureter appears unremarkable. There is severe right hydronephrosis with a transition zone in the right ureteropelvic junction. No calculus identified. The right UPJ stricture is not excluded. There is apparent rotation of the bladder wall, likely related to chronic bladder outlet obstruction. The prostate gland is grossly unremarkable with moderate stool noted throughout the colon. There are scattered colonic diverticula without acute inflammatory changes. There is no evidence of bowel obstruction. The appendix appears unremarkable.  There is aortoiliac atherosclerotic disease. There is no retroperitoneal, para-aortic, aortocaval adenopathy. There is a 11 x 9 cm reason streak mass in the left hemiabdomen in compatible with metastatic disease. There are innumerable omental nodules compatible with metastatic implant. Degenerative changes of the spine. No acute fracture. Check Dr. A 1 cm lucent focus in the  left iliac bone lateral to the SI joints (series 2, image 66) is not well characterized but may represent a focal osteopenic area. Lytic metastatic disease is not excluded.  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 This mass may arise from the right kidney, or the right adrenal gland with possible invasion into the liver.  Large soft tissue mass in the left hemiabdomen compatible with metastatic disease. There is extensive omental implants.  Retroperitoneal adenopathy.  Innumerable pulmonary metastatic disease.  No evidence of bowel obstruction.  Severe  right hydronephrosis with transition zone at the right UPJ. No stone identified.   Electronically Signed   By: Anner Crete M.D.   On: 12/02/2014 19:02    Labs:  CBC:  Recent Labs  12/02/14 1457 12/10/14 0947  WBC 14.0* 17.5*  HGB 10.6* 10.0*  HCT 33.8* 31.5*  PLT 349 340    COAGS:  Recent Labs  12/10/14 0947  INR 1.13  APTT 30    BMP:  Recent Labs  12/02/14 1457  NA 139  K 3.3*  CL 101  CO2 27  GLUCOSE 172*  BUN 43*  CALCIUM 8.5*  CREATININE 2.11*  GFRNONAA 28*  GFRAA 33*    LIVER FUNCTION TESTS:  Recent Labs  12/02/14 1457  BILITOT 0.6  AST 44*  ALT 20  ALKPHOS 177*  PROT 6.4*  ALBUMIN 2.7*    TUMOR MARKERS: No results for input(s): AFPTM, CEA, CA199, CHROMGRNA in the last 8760 hours.  Assessment and Plan:  abd pain worsening Work up reveals several abnormal findings: pulm nodules; RP LAN; suprarenal mass; LLQ mass Now scheduled for LLQ mass bx Risks and Benefits discussed with the patient's family including, but not limited to bleeding, infection, damage to adjacent structures or low yield requiring additional tests. All of the patient's family questions were answered, they agreeable to proceed. Consent signed and in chart.   Thank you for this interesting consult.  I greatly enjoyed meeting ANGELINO RUMERY and look forward to participating in their care.  A copy of this report was sent to the requesting provider on this date.  Signed: Shakeira Rhee A 12/10/2014, 10:40 AM   I spent a total of  30 Minutes   in face to face in clinical consultation, greater than 50% of which was counseling/coordinating care for LLQ mass bx

## 2014-12-10 NOTE — Procedures (Signed)
LLQ mass Bx 18 g times 4 No comp/EBL

## 2014-12-14 ENCOUNTER — Emergency Department (HOSPITAL_COMMUNITY): Payer: Medicare Other

## 2014-12-14 ENCOUNTER — Encounter (HOSPITAL_COMMUNITY): Payer: Self-pay | Admitting: Emergency Medicine

## 2014-12-14 ENCOUNTER — Inpatient Hospital Stay (HOSPITAL_COMMUNITY): Payer: Medicare Other

## 2014-12-14 ENCOUNTER — Inpatient Hospital Stay (HOSPITAL_COMMUNITY)
Admission: EM | Admit: 2014-12-14 | Discharge: 2014-12-18 | DRG: 682 | Disposition: A | Discharge status: Hospice | Payer: Medicare Other | Attending: Internal Medicine | Admitting: Internal Medicine

## 2014-12-14 DIAGNOSIS — I471 Supraventricular tachycardia: Secondary | ICD-10-CM | POA: Diagnosis not present

## 2014-12-14 DIAGNOSIS — C7800 Secondary malignant neoplasm of unspecified lung: Secondary | ICD-10-CM | POA: Diagnosis present

## 2014-12-14 DIAGNOSIS — F419 Anxiety disorder, unspecified: Secondary | ICD-10-CM | POA: Diagnosis present

## 2014-12-14 DIAGNOSIS — R591 Generalized enlarged lymph nodes: Secondary | ICD-10-CM | POA: Diagnosis present

## 2014-12-14 DIAGNOSIS — N184 Chronic kidney disease, stage 4 (severe): Secondary | ICD-10-CM | POA: Diagnosis present

## 2014-12-14 DIAGNOSIS — R64 Cachexia: Secondary | ICD-10-CM | POA: Diagnosis present

## 2014-12-14 DIAGNOSIS — C7970 Secondary malignant neoplasm of unspecified adrenal gland: Secondary | ICD-10-CM | POA: Diagnosis present

## 2014-12-14 DIAGNOSIS — D638 Anemia in other chronic diseases classified elsewhere: Secondary | ICD-10-CM | POA: Diagnosis not present

## 2014-12-14 DIAGNOSIS — C649 Malignant neoplasm of unspecified kidney, except renal pelvis: Secondary | ICD-10-CM | POA: Diagnosis present

## 2014-12-14 DIAGNOSIS — H9190 Unspecified hearing loss, unspecified ear: Secondary | ICD-10-CM | POA: Diagnosis present

## 2014-12-14 DIAGNOSIS — E279 Disorder of adrenal gland, unspecified: Secondary | ICD-10-CM | POA: Diagnosis present

## 2014-12-14 DIAGNOSIS — Z79899 Other long term (current) drug therapy: Secondary | ICD-10-CM

## 2014-12-14 DIAGNOSIS — D72829 Elevated white blood cell count, unspecified: Secondary | ICD-10-CM | POA: Diagnosis not present

## 2014-12-14 DIAGNOSIS — N179 Acute kidney failure, unspecified: Principal | ICD-10-CM | POA: Diagnosis present

## 2014-12-14 DIAGNOSIS — R627 Adult failure to thrive: Secondary | ICD-10-CM | POA: Diagnosis present

## 2014-12-14 DIAGNOSIS — R109 Unspecified abdominal pain: Secondary | ICD-10-CM | POA: Diagnosis not present

## 2014-12-14 DIAGNOSIS — I129 Hypertensive chronic kidney disease with stage 1 through stage 4 chronic kidney disease, or unspecified chronic kidney disease: Secondary | ICD-10-CM | POA: Diagnosis present

## 2014-12-14 DIAGNOSIS — Z6820 Body mass index (BMI) 20.0-20.9, adult: Secondary | ICD-10-CM | POA: Diagnosis not present

## 2014-12-14 DIAGNOSIS — E119 Type 2 diabetes mellitus without complications: Secondary | ICD-10-CM

## 2014-12-14 DIAGNOSIS — D630 Anemia in neoplastic disease: Secondary | ICD-10-CM | POA: Diagnosis present

## 2014-12-14 DIAGNOSIS — N1330 Unspecified hydronephrosis: Secondary | ICD-10-CM | POA: Diagnosis present

## 2014-12-14 DIAGNOSIS — Z7189 Other specified counseling: Secondary | ICD-10-CM | POA: Diagnosis not present

## 2014-12-14 DIAGNOSIS — E43 Unspecified severe protein-calorie malnutrition: Secondary | ICD-10-CM | POA: Diagnosis present

## 2014-12-14 DIAGNOSIS — A419 Sepsis, unspecified organism: Secondary | ICD-10-CM | POA: Diagnosis present

## 2014-12-14 DIAGNOSIS — N189 Chronic kidney disease, unspecified: Secondary | ICD-10-CM

## 2014-12-14 DIAGNOSIS — J189 Pneumonia, unspecified organism: Secondary | ICD-10-CM | POA: Diagnosis present

## 2014-12-14 DIAGNOSIS — I959 Hypotension, unspecified: Secondary | ICD-10-CM | POA: Diagnosis present

## 2014-12-14 DIAGNOSIS — C801 Malignant (primary) neoplasm, unspecified: Secondary | ICD-10-CM | POA: Diagnosis not present

## 2014-12-14 DIAGNOSIS — R1084 Generalized abdominal pain: Secondary | ICD-10-CM | POA: Diagnosis not present

## 2014-12-14 DIAGNOSIS — Z515 Encounter for palliative care: Secondary | ICD-10-CM

## 2014-12-14 DIAGNOSIS — R000 Tachycardia, unspecified: Secondary | ICD-10-CM | POA: Diagnosis present

## 2014-12-14 DIAGNOSIS — Z66 Do not resuscitate: Secondary | ICD-10-CM | POA: Diagnosis present

## 2014-12-14 DIAGNOSIS — N2889 Other specified disorders of kidney and ureter: Secondary | ICD-10-CM | POA: Diagnosis present

## 2014-12-14 DIAGNOSIS — M109 Gout, unspecified: Secondary | ICD-10-CM | POA: Diagnosis present

## 2014-12-14 DIAGNOSIS — R6510 Systemic inflammatory response syndrome (SIRS) of non-infectious origin without acute organ dysfunction: Secondary | ICD-10-CM | POA: Diagnosis present

## 2014-12-14 DIAGNOSIS — E222 Syndrome of inappropriate secretion of antidiuretic hormone: Secondary | ICD-10-CM | POA: Diagnosis present

## 2014-12-14 DIAGNOSIS — C787 Secondary malignant neoplasm of liver and intrahepatic bile duct: Secondary | ICD-10-CM | POA: Diagnosis present

## 2014-12-14 DIAGNOSIS — E860 Dehydration: Secondary | ICD-10-CM | POA: Diagnosis present

## 2014-12-14 DIAGNOSIS — I509 Heart failure, unspecified: Secondary | ICD-10-CM | POA: Diagnosis present

## 2014-12-14 DIAGNOSIS — I9589 Other hypotension: Secondary | ICD-10-CM

## 2014-12-14 DIAGNOSIS — E1122 Type 2 diabetes mellitus with diabetic chronic kidney disease: Secondary | ICD-10-CM | POA: Diagnosis present

## 2014-12-14 LAB — APTT: aPTT: 31 seconds (ref 24–37)

## 2014-12-14 LAB — CBC WITH DIFFERENTIAL/PLATELET
BASOS PCT: 0 % (ref 0–1)
Basophils Absolute: 0 10*3/uL (ref 0.0–0.1)
EOS PCT: 0 % (ref 0–5)
Eosinophils Absolute: 0 10*3/uL (ref 0.0–0.7)
HCT: 31.8 % — ABNORMAL LOW (ref 39.0–52.0)
HEMOGLOBIN: 10.1 g/dL — AB (ref 13.0–17.0)
Lymphocytes Relative: 5 % — ABNORMAL LOW (ref 12–46)
Lymphs Abs: 1.3 10*3/uL (ref 0.7–4.0)
MCH: 31.3 pg (ref 26.0–34.0)
MCHC: 31.8 g/dL (ref 30.0–36.0)
MCV: 98.5 fL (ref 78.0–100.0)
MONO ABS: 1.5 10*3/uL — AB (ref 0.1–1.0)
MONOS PCT: 6 % (ref 3–12)
Neutro Abs: 22.9 10*3/uL — ABNORMAL HIGH (ref 1.7–7.7)
Neutrophils Relative %: 89 % — ABNORMAL HIGH (ref 43–77)
PLATELETS: 343 10*3/uL (ref 150–400)
RBC: 3.23 MIL/uL — AB (ref 4.22–5.81)
RDW: 15.4 % (ref 11.5–15.5)
WBC: 25.7 10*3/uL — AB (ref 4.0–10.5)

## 2014-12-14 LAB — COMPREHENSIVE METABOLIC PANEL
ALK PHOS: 294 U/L — AB (ref 38–126)
ALT: 27 U/L (ref 17–63)
AST: 81 U/L — AB (ref 15–41)
Albumin: 2 g/dL — ABNORMAL LOW (ref 3.5–5.0)
Anion gap: 11 (ref 5–15)
BILIRUBIN TOTAL: 1 mg/dL (ref 0.3–1.2)
BUN: 52 mg/dL — ABNORMAL HIGH (ref 6–20)
CALCIUM: 7.9 mg/dL — AB (ref 8.9–10.3)
CO2: 27 mmol/L (ref 22–32)
Chloride: 98 mmol/L — ABNORMAL LOW (ref 101–111)
Creatinine, Ser: 2.75 mg/dL — ABNORMAL HIGH (ref 0.61–1.24)
GFR calc Af Amer: 24 mL/min — ABNORMAL LOW (ref >60)
GFR calc non Af Amer: 20 mL/min — ABNORMAL LOW (ref >60)
Glucose, Bld: 123 mg/dL — ABNORMAL HIGH (ref 65–99)
Potassium: 4.4 mmol/L (ref 3.5–5.1)
Sodium: 136 mmol/L (ref 135–145)
TOTAL PROTEIN: 5.8 g/dL — AB (ref 6.5–8.1)

## 2014-12-14 LAB — URINALYSIS, ROUTINE W REFLEX MICROSCOPIC
GLUCOSE, UA: NEGATIVE mg/dL
Hgb urine dipstick: NEGATIVE
KETONES UR: NEGATIVE mg/dL
Leukocytes, UA: NEGATIVE
Nitrite: NEGATIVE
Protein, ur: 30 mg/dL — AB
SPECIFIC GRAVITY, URINE: 1.018 (ref 1.005–1.030)
Urobilinogen, UA: 1 mg/dL (ref 0.0–1.0)
pH: 5.5 (ref 5.0–8.0)

## 2014-12-14 LAB — PROTIME-INR
INR: 1.2 (ref 0.00–1.49)
PROTHROMBIN TIME: 15.4 s — AB (ref 11.6–15.2)

## 2014-12-14 LAB — LIPASE, BLOOD: Lipase: 17 U/L — ABNORMAL LOW (ref 22–51)

## 2014-12-14 LAB — LACTIC ACID, PLASMA
Lactic Acid, Venous: 2.1 mmol/L (ref 0.5–2.0)
Lactic Acid, Venous: 2.1 mmol/L (ref 0.5–2.0)

## 2014-12-14 LAB — URINE MICROSCOPIC-ADD ON

## 2014-12-14 LAB — MRSA PCR SCREENING: MRSA BY PCR: NEGATIVE

## 2014-12-14 LAB — PROCALCITONIN: Procalcitonin: 4.32 ng/mL

## 2014-12-14 MED ORDER — ALLOPURINOL 100 MG PO TABS
100.0000 mg | ORAL_TABLET | Freq: Every day | ORAL | Status: DC
  Administered 2014-12-14 – 2014-12-18 (×5): 100 mg via ORAL
  Filled 2014-12-14 (×5): qty 1

## 2014-12-14 MED ORDER — ENOXAPARIN SODIUM 30 MG/0.3ML ~~LOC~~ SOLN
30.0000 mg | Freq: Every day | SUBCUTANEOUS | Status: DC
  Administered 2014-12-14 – 2014-12-17 (×4): 30 mg via SUBCUTANEOUS
  Filled 2014-12-14 (×5): qty 0.3

## 2014-12-14 MED ORDER — ALUM & MAG HYDROXIDE-SIMETH 200-200-20 MG/5ML PO SUSP
30.0000 mL | Freq: Four times a day (QID) | ORAL | Status: DC | PRN
  Administered 2014-12-16 – 2014-12-17 (×2): 30 mL via ORAL
  Filled 2014-12-14 (×2): qty 30

## 2014-12-14 MED ORDER — INSULIN ASPART 100 UNIT/ML ~~LOC~~ SOLN
0.0000 [IU] | SUBCUTANEOUS | Status: DC
  Administered 2014-12-15 (×2): 1 [IU] via SUBCUTANEOUS
  Administered 2014-12-15 – 2014-12-16 (×2): 2 [IU] via SUBCUTANEOUS
  Administered 2014-12-16 – 2014-12-18 (×4): 1 [IU] via SUBCUTANEOUS

## 2014-12-14 MED ORDER — SODIUM CHLORIDE 0.9 % IV SOLN
1000.0000 mL | Freq: Once | INTRAVENOUS | Status: AC
  Administered 2014-12-14: 1000 mL via INTRAVENOUS

## 2014-12-14 MED ORDER — SODIUM CHLORIDE 0.9 % IV BOLUS (SEPSIS)
1000.0000 mL | Freq: Once | INTRAVENOUS | Status: AC
  Administered 2014-12-14: 1000 mL via INTRAVENOUS

## 2014-12-14 MED ORDER — IOHEXOL 300 MG/ML  SOLN
50.0000 mL | Freq: Once | INTRAMUSCULAR | Status: AC | PRN
  Administered 2014-12-14: 50 mL via ORAL

## 2014-12-14 MED ORDER — SODIUM CHLORIDE 0.9 % IV SOLN
INTRAVENOUS | Status: DC
  Administered 2014-12-14: 23:00:00 via INTRAVENOUS
  Administered 2014-12-15: 1000 mL via INTRAVENOUS
  Administered 2014-12-16 – 2014-12-17 (×2): via INTRAVENOUS

## 2014-12-14 MED ORDER — ONDANSETRON HCL 4 MG PO TABS: 4.0000 mg | ORAL_TABLET | Freq: Four times a day (QID) | ORAL | Status: DC | PRN

## 2014-12-14 MED ORDER — OXYCODONE HCL 5 MG PO TABS
5.0000 mg | ORAL_TABLET | ORAL | Status: DC | PRN
  Administered 2014-12-15 – 2014-12-18 (×10): 5 mg via ORAL
  Filled 2014-12-14 (×10): qty 1

## 2014-12-14 MED ORDER — VANCOMYCIN HCL IN DEXTROSE 1-5 GM/200ML-% IV SOLN
1000.0000 mg | Freq: Once | INTRAVENOUS | Status: AC
  Administered 2014-12-14: 1000 mg via INTRAVENOUS
  Filled 2014-12-14: qty 200

## 2014-12-14 MED ORDER — SODIUM CHLORIDE 0.9 % IV BOLUS (SEPSIS)
1000.0000 mL | INTRAVENOUS | Status: AC
  Administered 2014-12-14: 1000 mL via INTRAVENOUS

## 2014-12-14 MED ORDER — PIPERACILLIN-TAZOBACTAM 3.375 G IVPB 30 MIN
3.3750 g | Freq: Once | INTRAVENOUS | Status: AC
  Administered 2014-12-14: 3.375 g via INTRAVENOUS
  Filled 2014-12-14: qty 50

## 2014-12-14 MED ORDER — ONDANSETRON HCL 4 MG/2ML IJ SOLN: 4.0000 mg | Freq: Four times a day (QID) | INTRAMUSCULAR | Status: DC | PRN

## 2014-12-14 MED ORDER — TAMSULOSIN HCL 0.4 MG PO CAPS
0.4000 mg | ORAL_CAPSULE | Freq: Every day | ORAL | Status: DC
  Administered 2014-12-14 – 2014-12-18 (×5): 0.4 mg via ORAL
  Filled 2014-12-14 (×5): qty 1

## 2014-12-14 MED ORDER — ACETAMINOPHEN 650 MG RE SUPP: 650.0000 mg | Freq: Four times a day (QID) | RECTAL | Status: DC | PRN

## 2014-12-14 MED ORDER — ACETAMINOPHEN 325 MG PO TABS
650.0000 mg | ORAL_TABLET | Freq: Four times a day (QID) | ORAL | Status: DC | PRN
Start: 2014-12-14 — End: 2014-12-18

## 2014-12-14 MED ORDER — SODIUM CHLORIDE 0.9 % IV BOLUS (SEPSIS)
500.0000 mL | Freq: Once | INTRAVENOUS | Status: AC
  Administered 2014-12-15: 500 mL via INTRAVENOUS

## 2014-12-14 MED ORDER — ENSURE ENLIVE PO LIQD
237.0000 mL | Freq: Three times a day (TID) | ORAL | Status: DC
  Administered 2014-12-15 – 2014-12-17 (×5): 237 mL via ORAL

## 2014-12-14 MED ORDER — SIMETHICONE 80 MG PO CHEW
160.0000 mg | CHEWABLE_TABLET | Freq: Four times a day (QID) | ORAL | Status: DC | PRN
  Filled 2014-12-14: qty 2

## 2014-12-14 MED ORDER — HYDROMORPHONE HCL 1 MG/ML IJ SOLN
0.5000 mg | INTRAMUSCULAR | Status: DC | PRN
  Administered 2014-12-14: 1 mg via INTRAVENOUS
  Filled 2014-12-14: qty 1

## 2014-12-14 MED ORDER — SODIUM CHLORIDE 0.9 % IV SOLN: INTRAVENOUS | Status: DC

## 2014-12-14 NOTE — H&P (Signed)
[facility] Admission History and Physical       Darren Brennan ASN:[id number] DOB: January 20, 1933 DOA: 12/14/2014  Referring physician: EDP PCP: Leonard Downing, MD  Specialists:   Chief Complaint: ABD Pain and Swelling  HPI: Darren Brennan is a 81 y.o. male with a history of CHF, CKD, HTN, Gout, and Recent Biopsy of a Right Renal Mass 5 days ago who present to the ED with complaints of 10/10 generalized dull ABD Pain and Distention, along with nausea, and poor appetite x 5 days. He has had less energy and has been laying in bed and occasionally will get up into a wheelchair per family.   He has had an unintentional weight loss of 15 pounds over the last month.    He was evaluated in the ED and was found to have an elevated BUN/Cr and a leukocytosis as well as tachycardia, and a Ct of the ABD was performed and revealed a Large Right Flank Mass with Hydronephrosis, hepatic and Lung masses, peritoneal implants a left adrenal mass, as well as increased consolidation of the right lower lung base.      Review of Systems:  Constitutional: +Weight Loss, No Weight Gain, Night Sweats, Fevers, Chills, Dizziness, Light Headedness, +Fatigue, +Generalized Weakness HEENT: No Headaches, Difficulty Swallowing,Tooth/Dental Problems,Sore Throat,  No Sneezing, Rhinitis, Ear Ache, Nasal Congestion, or Post Nasal Drip,  Cardio-vascular:  No Chest pain, Orthopnea, PND, Edema in Lower Extremities, Anasarca, Dizziness, Palpitations  Resp: No Dyspnea, No DOE, No Productive Cough, No Non-Productive Cough, No Hemoptysis, No Wheezing.    GI: No Heartburn, Indigestion, +Abdominal Pain, +Nausea, Vomiting, Diarrhea, +Constipation, Hematemesis, Hematochezia, Melena, Change in Bowel Habits,  +Loss of Appetite  GU: No Dysuria, No Change in Color of Urine, No Urgency or Urinary Frequency, No Flank pain.  Musculoskeletal: No Joint Pain or Swelling, No Decreased Range of Motion, No Back Pain.  Neurologic: No  Syncope, No Seizures, Muscle Weakness, Paresthesia, Vision Disturbance or Loss, No Diplopia, No Vertigo, No Difficulty Walking,  Skin: No Rash or Lesions. Psych: No Change in Mood or Affect, No Depression or Anxiety, No Memory loss, No Confusion, or Hallucinations   Past Medical History  Diagnosis Date  . Hypertension   . Diabetes mellitus without complication   . Gout   . Renal disorder   . CHF (congestive heart failure)      History reviewed. No pertinent past surgical history.    Prior to Admission medications   Medication Sig Start Date End Date Taking? Authorizing Provider  acetaminophen (TYLENOL) 500 MG tablet Take 500 mg by mouth every 12 (twelve) hours as needed for mild pain, moderate pain or headache.   Yes Historical Provider, MD  acetaminophen-codeine (TYLENOL #3) 300-30 MG per tablet Take 1-2 tablets by mouth every 4 (four) hours as needed for moderate pain.   Yes Historical Provider, MD  allopurinol (ZYLOPRIM) 100 MG tablet Take 100 mg by mouth daily.    Yes Historical Provider, MD  Ca Carbonate-Mag Hydroxide (ROLAIDS PO) Take 1 tablet by mouth daily as needed (indigestion, heartburn).   Yes Historical Provider, MD  ENSURE (ENSURE) Take 237 mLs by mouth daily as needed (appetitte).    Yes Historical Provider, MD  fluticasone (FLONASE) 50 MCG/ACT nasal spray Place 2 sprays into both nostrils daily as needed for allergies.  10/31/14  Yes Historical Provider, MD  furosemide (LASIX) 40 MG tablet Take 120 mg by mouth daily.    Yes Historical Provider, MD  metoprolol (LOPRESSOR) 50 MG tablet Take  25 mg by mouth daily.   Yes Historical Provider, MD  ondansetron (ZOFRAN) 8 MG tablet Take 8 mg by mouth every 8 (eight) hours as needed for nausea or vomiting.   Yes Historical Provider, MD  pantoprazole (PROTONIX) 40 MG tablet Take 40 mg by mouth daily. 11/10/14  Yes Historical Provider, MD  simethicone (MYLICON) 244 MG chewable tablet Chew 125 mg by mouth every 6 (six) hours as needed  for flatulence.   Yes Historical Provider, MD  tamsulosin (FLOMAX) 0.4 MG CAPS Take 0.4 mg by mouth daily.   Yes Historical Provider, MD     No Known Allergies     Social History:  reports that he has never smoked. He does not have any smokeless tobacco history on file. He reports that he does not drink alcohol or use illicit drugs.      History reviewed. No pertinent family history.     Physical Exam:  GEN:  Pleasant  Frail Elderly Cachectic  81 y.o. Caucasian male examined and in no acute distress; cooperative with exam Filed Vitals:   12/14/14 1706 12/14/14 1800  BP: 107/75 106/65  Pulse: 118 120  Temp: 97.7 F (36.5 C)   TempSrc: Oral   Resp: 18 34  SpO2: 92% 91%   Blood pressure 106/65, pulse 120, temperature 97.7 F (36.5 C), temperature source Oral, resp. rate 34, SpO2 91 %. PSYCH: He is alert and oriented x4; does not appear anxious does not appear depressed; affect is normal HEENT: Normocephalic and Atraumatic, Mucous membranes pink; PERRLA; EOM intact; Fundi:  Benign;  No scleral icterus, Nares: Patent, Oropharynx: Clear, Poor Dentition,    Neck:  FROM, No Cervical Lymphadenopathy nor Thyromegaly or Carotid Bruit; No JVD; Breasts:: Not examined CHEST WALL: No tenderness CHEST: Normal respiration, clear to auscultation bilaterally HEART: Regular rate and rhythm; no murmurs rubs or gallops BACK: No kyphosis or scoliosis; No CVA tenderness ABDOMEN: Positive Bowel Sounds, Scaphoid, Distended, Diffusely Tender ABD, No Rebound or Guarding; No Masses, No Organomegaly, Rectal Exam: Not done EXTREMITIES: No Cyanosis, Clubbing, or Edema; No Ulcerations. Genitalia: not examined PULSES: 2+ and symmetric SKIN: Normal hydration no rash or ulceration CNS:  Alert and Oriented x 4, No Focal Deficits, Generalized Weakness Vascular: pulses palpable throughout    Labs on Admission:  Basic Metabolic Panel:  Recent Labs Lab 12/14/14 1711  NA 136  K 4.4  CL 98*  CO2 27    GLUCOSE 123*  BUN 52*  CREATININE 2.75*  CALCIUM 7.9*   Liver Function Tests:  Recent Labs Lab 12/14/14 1711  AST 81*  ALT 27  ALKPHOS 294*  BILITOT 1.0  PROT 5.8*  ALBUMIN 2.0*    Recent Labs Lab 12/14/14 1711  LIPASE 17*   No results for input(s): AMMONIA in the last 168 hours. CBC:  Recent Labs Lab 12/10/14 0947 12/14/14 1710  WBC 17.5* 25.7*  NEUTROABS  --  22.9*  HGB 10.0* 10.1*  HCT 31.5* 31.8*  MCV 96.0 98.5  PLT 340 343   Cardiac Enzymes: No results for input(s): CKTOTAL, CKMB, CKMBINDEX, TROPONINI in the last 168 hours.  BNP (last 3 results) No results for input(s): BNP in the last 8760 hours.  ProBNP (last 3 results) No results for input(s): PROBNP in the last 8760 hours.  CBG: No results for input(s): GLUCAP in the last 168 hours.  Radiological Exams on Admission: Ct Abdomen Pelvis Wo Contrast  12/14/2014   CLINICAL DATA:  Generalized abdominal pain, status post renal biopsy 5 days  ago  EXAM: CT ABDOMEN AND PELVIS WITHOUT CONTRAST  TECHNIQUE: Multidetector CT imaging of the abdomen and pelvis was performed following the standard protocol without IV contrast.  COMPARISON:  12/10/2014, 12/02/2014  FINDINGS: Innumerable round bilateral lung masses unchanged. Increased consolidation right lung base. New small right pleural effusion.  Large right abdominal mass in the vicinity of the right kidney right adrenal gland and right lobe of the liver which may arise from any of the structures with involvement or invasion of the others. 2.5 cm mass adjacent to this in the inferior right lobe of the liver also stable. Gallbladder within normal limits. Dilated right renal pelvis unchanged.  Pancreas normal except for displacement by mass effect related to the mass. Spleen normal. Left adrenal gland nodularity stable. Large mass posterior and inferior to the stomach measures up to 7.5 cm, unchanged. Numerous enlarged peritoneal lymph nodes/implants stable. Right flank  peritoneal implant measures 5.5 cm. Just to the right of midline is another peritoneal implant measuring 3.8 cm. In the left flank there is a large soft tissue mass measuring 13 x 12 cm. Stable retroperitoneal periaortic adenopathy.  Small volume ascites inferior to the liver similar to prior study.  Mild enlargement of the prostate. Bladder is normal. No acute abnormality involving bowel or bladder.  IMPRESSION: Large right flank mass without significant interval change consistent with malignancy. Hepatic mass, lung masses, and peritoneal implants with left adrenal nodularity and enlarged abdominal lymph nodes all without significant interval change.  Right hydronephrosis with transition at the ureteropelvic junction unchanged.   Electronically Signed   By: Skipper Cliche M.D.   On: 12/14/2014 18:45     Assessment/Plan:   81 y.o. male with  Principal Problem:   1.     SIRS (systemic inflammatory response syndrome)-   Sepsis, RLL Consolidation from Post Obstructive Pneumonia   IV Vancomycin and Zosyn   IVFs   Active Problems:   2.    Hypotension- due to Sepsis/SIRS   IVFs for Fluid Resuscitation     3.    Sinus tachycardia   IVFs and Treatment of Sepsis     4.    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 Osker Mason     5.    Leukocytosis- due to #1 anda component of pain and Stress Rxn   Monitor Trend     6.    Acute on chronic renal failure- BUN/Cr = 52/2.75, baseline    IVFs   Monitor Trend of BUN/Cr     7.   Failure to thrive in adult- Most Likely a consequence of Neoplastic Disease   Ensure TID between meals   Liquid diet advance as tolerated   May need appetite stimulant     8.   Right renal mass- Bx done on 12/10/2014   Results Pending     9.   Diabetes mellitus without complication   SSI coverage PRN   Check HbA1C   10.   Gout   Continue Allopurinol Rx   11.   CHF (congestive heart  failure)   Monitor for fluid Overload        12.  DVT Prophylaxis   Lovenox          Code Status:     FULL CODE        Family Communication:   Family at Bedside       Disposition Plan:    Inpatient  Status        Time spent:  [facility] Pager [phone]   If [place] Please Contact the Day Rounding Team MD for [facility]  If 7PM-7AM, Please Contact Night-Floor Coverage  [email or web address] Password [email or web address] 12/14/2014, 8:30 PM     ADDENDUM:   Patient was seen and examined on 12/14/2014

## 2014-12-14 NOTE — ED Notes (Signed)
Bed: WA21 Expected date: 12/14/14 Expected time: 4:48 PM Means of arrival: Ambulance Comments: abd pain

## 2014-12-14 NOTE — ED Provider Notes (Signed)
CSN: [id number]     Arrival date & time 12/14/14  1651 History   First MD Initiated Contact with Patient 12/14/14 1656     Chief Complaint  Patient presents with  . Abdominal Pain     (Consider location/radiation/quality/duration/timing/severity/associated sxs/prior Treatment) Patient is a 81 y.o. male presenting with abdominal pain.  Abdominal Pain Pain location:  Generalized Pain quality: aching   Pain radiates to:  Does not radiate Pain severity:  Mild Onset quality:  Gradual Duration:  3 days Timing:  Constant Progression:  Worsening Associated symptoms: constipation   Associated symptoms: no chills, no fever, no nausea and no vomiting     Past Medical History  Diagnosis Date  . Hypertension   . Diabetes mellitus without complication   . Gout   . Renal disorder   . CHF (congestive heart failure)    History reviewed. No pertinent past surgical history. History reviewed. No pertinent family history. History  Substance Use Topics  . Smoking status: Never Smoker   . Smokeless tobacco: Not on file  . Alcohol Use: No    Review of Systems  Constitutional: Negative for fever and chills.  HENT: Negative for congestion and drooling.   Eyes: Negative for pain.  Gastrointestinal: Positive for abdominal pain, constipation and abdominal distention. Negative for nausea and vomiting.  Endocrine: Negative for polydipsia and polyuria.  Genitourinary: Negative for flank pain.  Musculoskeletal: Negative for back pain.  Skin: Negative for rash and wound.  Neurological: Negative for seizures.  Psychiatric/Behavioral: Negative for agitation.      Allergies  Review of patient's allergies indicates no known allergies.  Home Medications   Prior to Admission medications   Medication Sig Start Date End Date Taking? Authorizing Provider  acetaminophen (TYLENOL) 500 MG tablet Take 500 mg by mouth every 12 (twelve) hours as needed for mild pain, moderate pain or headache.     Historical Provider, MD  allopurinol (ZYLOPRIM) 100 MG tablet Take 100 mg by mouth daily.     Historical Provider, MD  Ca Carbonate-Mag Hydroxide (ROLAIDS PO) Take 1 tablet by mouth daily as needed (indigestion, heartburn).    Historical Provider, MD  ENSURE (ENSURE) Take 237 mLs by mouth daily as needed (appetitte).     Historical Provider, MD  finasteride (PROSCAR) 5 MG tablet Take 5 mg by mouth daily.    Historical Provider, MD  fluticasone (FLONASE) 50 MCG/ACT nasal spray Place 2 sprays into both nostrils daily as needed for allergies.  10/31/14   Historical Provider, MD  furosemide (LASIX) 40 MG tablet Take 40 mg by mouth daily.    Historical Provider, MD  metoprolol (LOPRESSOR) 50 MG tablet Take 25 mg by mouth daily.    Historical Provider, MD  pantoprazole (PROTONIX) 40 MG tablet Take 40 mg by mouth daily. 11/10/14   Historical Provider, MD  simethicone (MYLICON) 010 MG chewable tablet Chew 125 mg by mouth every 6 (six) hours as needed for flatulence.    Historical Provider, MD  tamsulosin (FLOMAX) 0.4 MG CAPS Take 0.4 mg by mouth daily.    Historical Provider, MD  traMADol (ULTRAM) 50 MG tablet Take 50-100 mg by mouth every 4 (four) hours as needed for moderate pain or severe pain.  11/26/14   Historical Provider, MD   BP 107/75 mmHg  Pulse 118  Temp(Src) 97.7 F (36.5 C) (Oral)  Resp 18  SpO2 92% Physical Exam  Constitutional: He is oriented to person, place, and time. He appears well-developed and well-nourished.  HENT:  Head: Normocephalic and atraumatic.  Cardiovascular: Tachycardia present.   Abdominal: He exhibits distension. There is tenderness. There is no rebound and no guarding.  Neurological: He is alert and oriented to person, place, and time.  Skin: Skin is warm and dry.  Psychiatric: He has a normal mood and affect.    ED Course  Procedures (including critical care time) Labs Review Labs Reviewed  COMPREHENSIVE METABOLIC PANEL  LACTIC ACID, PLASMA  LIPASE, BLOOD   URINALYSIS, ROUTINE W REFLEX MICROSCOPIC (NOT AT [facility])    Imaging Review No results found.   EKG Interpretation None      MDM   Final diagnoses:  Abdominal pain   Poor historian, will wait for wife to gather more history. Apparently has had abdominal pain, distension, constipation x 3 days, progressively worsening. Will start with CT and labs along with fluids and analgesia with concern for possible infection, obstruction, or worsening mass.   CT with a large mass and multiple metastatic areas as well. He was having difficulty urinating at this time so I discussed the case with the medicine team about admission. They felt this could very well be sepsis and wanted me to wait for urine so I reiterated with the nurses immediately as soon as possible. Hospitalist admitted the patient in the meantime.    Merrily Pew, MD 12/14/14 2352

## 2014-12-14 NOTE — ED Notes (Signed)
Patient here with c/o of generalized abd pain non radiating. States that he had a renal biopsy 5 days ago and was diagnosed with a renal mass. Pain 10/10.

## 2014-12-15 DIAGNOSIS — C787 Secondary malignant neoplasm of liver and intrahepatic bile duct: Secondary | ICD-10-CM

## 2014-12-15 DIAGNOSIS — D638 Anemia in other chronic diseases classified elsewhere: Secondary | ICD-10-CM

## 2014-12-15 DIAGNOSIS — N1330 Unspecified hydronephrosis: Secondary | ICD-10-CM

## 2014-12-15 DIAGNOSIS — N2889 Other specified disorders of kidney and ureter: Secondary | ICD-10-CM

## 2014-12-15 DIAGNOSIS — C7800 Secondary malignant neoplasm of unspecified lung: Secondary | ICD-10-CM

## 2014-12-15 LAB — CBC
HCT: 27.7 % — ABNORMAL LOW (ref 39.0–52.0)
Hemoglobin: 8.8 g/dL — ABNORMAL LOW (ref 13.0–17.0)
MCH: 30.7 pg (ref 26.0–34.0)
MCHC: 31.8 g/dL (ref 30.0–36.0)
MCV: 96.5 fL (ref 78.0–100.0)
Platelets: 271 10*3/uL (ref 150–400)
RBC: 2.87 MIL/uL — ABNORMAL LOW (ref 4.22–5.81)
RDW: 15.3 % (ref 11.5–15.5)
WBC: 22.9 10*3/uL — AB (ref 4.0–10.5)

## 2014-12-15 LAB — GLUCOSE, CAPILLARY
GLUCOSE-CAPILLARY: 100 mg/dL — AB (ref 65–99)
GLUCOSE-CAPILLARY: 95 mg/dL (ref 65–99)
Glucose-Capillary: 142 mg/dL — ABNORMAL HIGH (ref 65–99)
Glucose-Capillary: 148 mg/dL — ABNORMAL HIGH (ref 65–99)
Glucose-Capillary: 159 mg/dL — ABNORMAL HIGH (ref 65–99)
Glucose-Capillary: 108 mg/dL — ABNORMAL HIGH (ref 65–99)

## 2014-12-15 LAB — BASIC METABOLIC PANEL
Anion gap: 7 (ref 5–15)
BUN: 51 mg/dL — AB (ref 6–20)
CALCIUM: 7 mg/dL — AB (ref 8.9–10.3)
CHLORIDE: 102 mmol/L (ref 101–111)
CO2: 25 mmol/L (ref 22–32)
Creatinine, Ser: 2.62 mg/dL — ABNORMAL HIGH (ref 0.61–1.24)
GFR, EST AFRICAN AMERICAN: 25 mL/min — AB (ref >60)
GFR, EST NON AFRICAN AMERICAN: 22 mL/min — AB (ref >60)
Glucose, Bld: 132 mg/dL — ABNORMAL HIGH (ref 65–99)
Potassium: 3.8 mmol/L (ref 3.5–5.1)
Sodium: 134 mmol/L — ABNORMAL LOW (ref 135–145)

## 2014-12-15 LAB — LACTIC ACID, PLASMA: Lactic Acid, Venous: 1.5 mmol/L (ref 0.5–2.0)

## 2014-12-15 MED ORDER — VANCOMYCIN HCL IN DEXTROSE 750-5 MG/150ML-% IV SOLN
750.0000 mg | INTRAVENOUS | Status: DC
  Administered 2014-12-15 – 2014-12-16 (×2): 750 mg via INTRAVENOUS
  Filled 2014-12-15 (×2): qty 150

## 2014-12-15 MED ORDER — PIPERACILLIN-TAZOBACTAM 3.375 G IVPB
3.3750 g | Freq: Three times a day (TID) | INTRAVENOUS | Status: DC
  Administered 2014-12-15 – 2014-12-16 (×5): 3.375 g via INTRAVENOUS
  Filled 2014-12-15 (×4): qty 50

## 2014-12-15 NOTE — Progress Notes (Signed)
[facility] PROGRESS NOTE  Darren Brennan PPI:[id number] DOB: [birth year] DOA: 12/14/2014 PCP: Leonard Downing, MD  Assessment/Plan:  Principal Problem:   SIRS (systemic inflammatory response syndrome) - continue broad spectrum antibiotics Vanc and Zosyn - CT scan of abdomen reports large right flank mass, hepatic mass, lung masses, peritoneal implants with left adrenal nodularity. Does not report source of infectious etiology - Chest x-ray does not report any infiltrates or consolidations - UA negative for leukocytes or nitrite  Active Problems:   Diabetes mellitus without complication - Place on diabetic diet - SSI    Gout - stable currently patient is on allopurinol    CHF (congestive heart failure) -Compensated currently    Hypotension - Resolved continue IV fluids for now. - Suspect some of this is secondary to poor oral intake as reported by patient and family    Acute on chronic renal failure - Most likely prerenal etiology given elevated BUN/creatinine ratio will plan on continuing IV fluid rehydration  Hyponatremia - Secondary to poor oral solute intake. Continue normal saline administration. We'll reassess next a.m.    Abdominal pain, generalized  - Most likely 2ary to metastatic carcinoma   Code Status: full Family Communication: None at bedside Disposition Plan: Pending further improvement in condition. D/c oncologist   Consultants:  Discussed with oncologist  Procedures:  none  Antibiotics:  Vancomycin and zosyn  HPI/Subjective: Pt has no new complaints.  Objective: Filed Vitals:   12/15/14 1302  BP: 107/64  Pulse: 109  Temp:   Resp: 30    Intake/Output Summary (Last 24 hours) at 12/15/14 1408 Last data filed at 12/15/14 1300  Gross per 24 hour  Intake   2050 ml  Output    250 ml  Net   1800 ml   Filed Weights   12/15/14 0000  Weight: 69.7 kg (153 lb 10.6 oz)    Exam:   General:  Pt in nad, alert and  awake  Cardiovascular: rrr, no mrg  Respiratory: cta bl, no wheezes  Abdomen: soft, generalized discomfort with deep palpation., no guarding.  Musculoskeletal: no cyanosis or clubbing   Data Reviewed: Basic Metabolic Panel:  Recent Labs Lab 12/14/14 1711 12/15/14 0102  NA 136 134*  K 4.4 3.8  CL 98* 102  CO2 27 25  GLUCOSE 123* 132*  BUN 52* 51*  CREATININE 2.75* 2.62*  CALCIUM 7.9* 7.0*   Liver Function Tests:  Recent Labs Lab 12/14/14 1711  AST 81*  ALT 27  ALKPHOS 294*  BILITOT 1.0  PROT 5.8*  ALBUMIN 2.0*    Recent Labs Lab 12/14/14 1711  LIPASE 17*   No results for input(s): AMMONIA in the last 168 hours. CBC:  Recent Labs Lab 12/10/14 0947 12/14/14 1710 12/15/14 0102  WBC 17.5* 25.7* 22.9*  NEUTROABS  --  22.9*  --   HGB 10.0* 10.1* 8.8*  HCT 31.5* 31.8* 27.7*  MCV 96.0 98.5 96.5  PLT 340 343 271   Cardiac Enzymes: No results for input(s): CKTOTAL, CKMB, CKMBINDEX, TROPONINI in the last 168 hours. BNP (last 3 results) No results for input(s): BNP in the last 8760 hours.  ProBNP (last 3 results) No results for input(s): PROBNP in the last 8760 hours.  CBG:  Recent Labs Lab 12/15/14 0736 12/15/14 1135  GLUCAP 95 142*    Recent Results (from the past 240 hour(s))  MRSA PCR Screening     Status: None   Collection Time: 12/14/14  9:01 PM  Result Value Ref Range Status  MRSA by PCR NEGATIVE NEGATIVE Final    Comment:        The GeneXpert MRSA Assay (FDA approved for NASAL specimens only), is one component of a comprehensive MRSA colonization surveillance program. It is not intended to diagnose MRSA infection nor to guide or monitor treatment for MRSA infections.      Studies: Ct Abdomen Pelvis Wo Contrast  12/14/2014   CLINICAL DATA:  Generalized abdominal pain, status post renal biopsy 5 days ago  EXAM: CT ABDOMEN AND PELVIS WITHOUT CONTRAST  TECHNIQUE: Multidetector CT imaging of the abdomen and pelvis was performed  following the standard protocol without IV contrast.  COMPARISON:  12/10/2014, 12/02/2014  FINDINGS: Innumerable round bilateral lung masses unchanged. Increased consolidation right lung base. New small right pleural effusion.  Large right abdominal mass in the vicinity of the right kidney right adrenal gland and right lobe of the liver which may arise from any of the structures with involvement or invasion of the others. 2.5 cm mass adjacent to this in the inferior right lobe of the liver also stable. Gallbladder within normal limits. Dilated right renal pelvis unchanged.  Pancreas normal except for displacement by mass effect related to the mass. Spleen normal. Left adrenal gland nodularity stable. Large mass posterior and inferior to the stomach measures up to 7.5 cm, unchanged. Numerous enlarged peritoneal lymph nodes/implants stable. Right flank peritoneal implant measures 5.5 cm. Just to the right of midline is another peritoneal implant measuring 3.8 cm. In the left flank there is a large soft tissue mass measuring 13 x 12 cm. Stable retroperitoneal periaortic adenopathy.  Small volume ascites inferior to the liver similar to prior study.  Mild enlargement of the prostate. Bladder is normal. No acute abnormality involving bowel or bladder.  IMPRESSION: Large right flank mass without significant interval change consistent with malignancy. Hepatic mass, lung masses, and peritoneal implants with left adrenal nodularity and enlarged abdominal lymph nodes all without significant interval change.  Right hydronephrosis with transition at the ureteropelvic junction unchanged.   Electronically Signed   By: Skipper Cliche M.D.   On: 12/14/2014 18:45   Dg Chest Port 1 View  12/14/2014   CLINICAL DATA:  Shortness of breath with sepsis.  Renal mass  EXAM: PORTABLE CHEST - 1 VIEW  COMPARISON:  Chest radiograph April 04, 2014  FINDINGS: There are nodular lesions throughout the lungs diffusely ranging in size from as  small as 3 mm to as large as 1.5 cm. None of these nodular lesions appear cavitated. The heart size and pulmonary vascular normal. No adenopathy apparent. No bone lesions.  IMPRESSION: Diffuse nodular opacities throughout the lungs. Suspect widespread metastatic disease. Septic emboli are possible, although lack of cavitation of any of these nodular lesions makes widespread metastatic disease more likely.   Electronically Signed   By: Lowella Grip III M.D.   On: 12/14/2014 20:42    Scheduled Meds: . allopurinol  100 mg Oral Daily  . enoxaparin (LOVENOX) injection  30 mg Subcutaneous QHS  . feeding supplement (ENSURE ENLIVE)  237 mL Oral TID BM  . insulin aspart  0-9 Units Subcutaneous 6 times per day  . piperacillin-tazobactam (ZOSYN)  IV  3.375 g Intravenous 3 times per day  . tamsulosin  0.4 mg Oral Daily  . vancomycin  750 mg Intravenous Q24H   Continuous Infusions: . sodium chloride 75 mL/hr at 12/15/14 0700    Time spent: > 35 minutes    Velvet Bathe  [facility] Pager [phone].  If 7PM-7AM, please contact night-coverage at [email or web address], password [facility] 12/15/2014, 2:08 PM  LOS: 1 day

## 2014-12-15 NOTE — Care Management Note (Signed)
Case Management Note  Patient Details  Name: Darren Brennan MRN: [id number] Date of Birth: March 14, 1933  Subjective/Objective:             sepsis       Action/Plan:  home   Expected Discharge Date:                 [id number] Expected Discharge Plan:  Home/Self Care  In-House Referral:  NA  Discharge planning Services  CM Consult  Post Acute Care Choice:  NA Choice offered to:  NA  DME Arranged:  N/A DME Agency:  NA  HH Arranged:  NA HH Agency:  NA  Status of Service:  In process, will continue to follow  Medicare Important Message Given:    Date Medicare IM Given:    Medicare IM give by:    Date Additional Medicare IM Given:    Additional Medicare Important Message give by:     If discussed at [place] of Stay Meetings, dates discussed:    Additional Comments:  Leeroy Cha, RN 12/15/2014, 10:33 AM

## 2014-12-15 NOTE — Consult Note (Signed)
Reason for consult: Advanced malignancy.   HPI: 81 year old gentleman the left seen in the past for mild anemia that have been chronic in nature and fairly asymptomatic. The last time I evaluated him back in January 2015. Most recently presented with symptoms of abdominal pain, dyspepsia and failure to thrive. He was evaluated by his primary care physician and had a CT scan on 12/02/2014. The scan showed a large right renal mass compatible with malignancy. Metastatic disease noted with pulmonary metastasis, hepatic metastasis and bulky adenopathy. He underwent a biopsy on 12/10/2014 of the left lower quadrant mass that was measuring 11 x 9 cm. He is scheduled to have a follow-up as an outpatient setting to discuss these results were presented to the emergency from around 12/14/2014 with failure to thrive and dehydration. I was asked to comment about these findings.  Clinically Darren Brennan is doing poorly. He is reporting abdominal pain and dyspepsia. He reported poor appetite and weight loss. He is essentially bedridden at this time. He does not report any headaches or blurry vision or syncope but does report lethargy and confusion at times. He does not report any fevers, chills, sweats but does report weight loss and poor appetite. He is not reporting any chest pain, palpitation, orthopnea . He does report dyspnea on exertion but no cough or hemoptysis. He does report nausea but no vomiting. Does report abdominal pain and dyspepsia. He does not report constipation or diarrhea. He does not report any skeletal complaints. Remaining review of systems unremarkable.   Past Medical History  Diagnosis Date  . Hypertension   . Diabetes mellitus without complication   . Gout   . Renal disorder   . CHF (congestive heart failure)   :  History reviewed. No pertinent past surgical history.:   Current facility-administered medications:  .  0.9 %  sodium chloride infusion, , Intravenous, Continuous, Theressa Millard, MD, Last Rate: 75 mL/hr at 12/15/14 0700 .  acetaminophen (TYLENOL) tablet 650 mg, 650 mg, Oral, Q6H PRN **OR** acetaminophen (TYLENOL) suppository 650 mg, 650 mg, Rectal, Q6H PRN, Theressa Millard, MD .  allopurinol (ZYLOPRIM) tablet 100 mg, 100 mg, Oral, Daily, Theressa Millard, MD, 100 mg at 12/15/14 1006 .  alum & mag hydroxide-simeth (MAALOX/MYLANTA) 200-200-20 MG/5ML suspension 30 mL, 30 mL, Oral, Q6H PRN, Theressa Millard, MD .  enoxaparin (LOVENOX) injection 30 mg, 30 mg, Subcutaneous, QHS, Theressa Millard, MD, 30 mg at 12/14/14 2349 .  feeding supplement (ENSURE ENLIVE) (ENSURE ENLIVE) liquid 237 mL, 237 mL, Oral, TID BM, Theressa Millard, MD, 237 mL at 12/15/14 1006 .  HYDROmorphone (DILAUDID) injection 0.5-1 mg, 0.5-1 mg, Intravenous, Q3H PRN, Theressa Millard, MD, 1 mg at 12/14/14 2258 .  insulin aspart (novoLOG) injection 0-9 Units, 0-9 Units, Subcutaneous, 6 times per day, Theressa Millard, MD, 2 Units at 12/15/14 0006 .  ondansetron (ZOFRAN) tablet 4 mg, 4 mg, Oral, Q6H PRN **OR** ondansetron (ZOFRAN) injection 4 mg, 4 mg, Intravenous, Q6H PRN, Theressa Millard, MD .  oxyCODONE (Oxy IR/ROXICODONE) immediate release tablet 5 mg, 5 mg, Oral, Q4H PRN, Theressa Millard, MD, 5 mg at 12/15/14 1006 .  piperacillin-tazobactam (ZOSYN) IVPB 3.375 g, 3.375 g, Intravenous, 3 times per day, Velvet Bathe, MD, 3.375 g at 12/15/14 0631 .  simethicone (MYLICON) chewable tablet 160 mg, 160 mg, Oral, Q6H PRN, Theressa Millard, MD .  tamsulosin (FLOMAX) capsule 0.4 mg, 0.4 mg, Oral, Daily, Theressa Millard, MD, 0.4 mg at  12/15/14 1006 .  vancomycin (VANCOCIN) IVPB 750 mg/150 ml premix, 750 mg, Intravenous, Q24H, Velvet Bathe, MD:  No Known Allergies:  History reviewed. No pertinent family history.:  History   Social History  . Marital Status: Married    Spouse Name: N/A  . Number of Children: N/A  . Years of Education: N/A   Occupational History  . Not on file.    Social History Main Topics  . Smoking status: Never Smoker   . Smokeless tobacco: Not on file  . Alcohol Use: No  . Drug Use: No  . Sexual Activity: Not on file   Other Topics Concern  . Not on file   Social History Narrative  :  Pertinent items are noted in HPI.  Exam: ECOG 4 Blood pressure 100/67, pulse 102, temperature 98.3 F (36.8 C), temperature source Axillary, resp. rate 35, height 6' (1.829 m), weight 153 lb 10.6 oz (69.7 kg), SpO2 99 %. General appearance: alert, mild distress and pale Head: Normocephalic, without obvious abnormality Throat: lips, mucosa, and tongue normal; teeth and gums normal Neck: no adenopathy Back: negative Resp: clear to auscultation bilaterally Chest wall: no tenderness Cardio: regular rate and rhythm, S1, S2 normal, no murmur, click, rub or gallop GI: soft, non-tender; bowel sounds normal; no masses,  no organomegaly and Tender to touch on deep palpation. Extremities: extremities normal, atraumatic, no cyanosis or edema Pulses: 2+ and symmetric Skin: Skin color, texture, turgor normal. No rashes or lesions Lymph nodes: Cervical, supraclavicular, and axillary nodes normal.   Recent Labs  12/14/14 1710 12/15/14 0102  WBC 25.7* 22.9*  HGB 10.1* 8.8*  HCT 31.8* 27.7*  PLT 343 271    Recent Labs  12/14/14 1711 12/15/14 0102  NA 136 134*  K 4.4 3.8  CL 98* 102  CO2 27 25  GLUCOSE 123* 132*  BUN 52* 51*  CREATININE 2.75* 2.62*  CALCIUM 7.9* 7.0*     Ct Abdomen Pelvis Wo Contrast  12/14/2014   CLINICAL DATA:  Generalized abdominal pain, status post renal biopsy 5 days ago  EXAM: CT ABDOMEN AND PELVIS WITHOUT CONTRAST  TECHNIQUE: Multidetector CT imaging of the abdomen and pelvis was performed following the standard protocol without IV contrast.  COMPARISON:  12/10/2014, 12/02/2014  FINDINGS: Innumerable round bilateral lung masses unchanged. Increased consolidation right lung base. New small right pleural effusion.  Large  right abdominal mass in the vicinity of the right kidney right adrenal gland and right lobe of the liver which may arise from any of the structures with involvement or invasion of the others. 2.5 cm mass adjacent to this in the inferior right lobe of the liver also stable. Gallbladder within normal limits. Dilated right renal pelvis unchanged.  Pancreas normal except for displacement by mass effect related to the mass. Spleen normal. Left adrenal gland nodularity stable. Large mass posterior and inferior to the stomach measures up to 7.5 cm, unchanged. Numerous enlarged peritoneal lymph nodes/implants stable. Right flank peritoneal implant measures 5.5 cm. Just to the right of midline is another peritoneal implant measuring 3.8 cm. In the left flank there is a large soft tissue mass measuring 13 x 12 cm. Stable retroperitoneal periaortic adenopathy.  Small volume ascites inferior to the liver similar to prior study.  Mild enlargement of the prostate. Bladder is normal. No acute abnormality involving bowel or bladder.  IMPRESSION: Large right flank mass without significant interval change consistent with malignancy. Hepatic mass, lung masses, and peritoneal implants with left adrenal nodularity and enlarged  abdominal lymph nodes all without significant interval change.  Right hydronephrosis with transition at the ureteropelvic junction unchanged.   Electronically Signed   By: Skipper Cliche M.D.   On: 12/14/2014 18:45   Ct Abdomen Pelvis Wo Contrast  12/02/2014   CLINICAL DATA:  81 year old male with left lower quadrant abdominal pain  EXAM: CT ABDOMEN AND PELVIS WITHOUT CONTRAST  TECHNIQUE: Multidetector CT imaging of the abdomen and pelvis was performed following the standard protocol without IV contrast.  COMPARISON:  None.  FINDINGS: Evaluation of this exam is limited in the absence of intravenous contrast.  There are innumerable bilateral pulmonary nodules measuring up to 1.5 cm compatible with metastatic  disease. There is focal subsegmental consolidation at the right lung base compatible with atelectasis/pneumonia. Top-normal cardiac size. There is coronary vascular calcification.  No intra-abdominal free air.  Small subhepatic free fluid.  There is the large right suprarenal mass measuring 10 x 14 cm in greatest axial dimension and up to 16 cm in craniocaudal length. This mass demonstrates areas of central necrosis. There is loss of fat plane between this lesion and superior pole of the kidney. There is apparent invasion of the mass into the right lobe of the liver. This mass may arise from the right kidney or right adrenal gland and is most compatible with malignancy. MRI without and with contrast is recommended for further characterization with bold there is a 2.0 x 1.8 cm hypodense lesion in the right lobe of the liver (series 2, image 33) which is not well characterized but may represent metastatic disease. A 1.8 by 1.8 cm hypodense lesion in the right hepatic lobe inferiorly is also not well characterized but may represent cyst. The gallbladder, pancreas, spleen, appear unremarkable. There is a 1.7 cm left adrenal nodule. The right adrenal gland is not visualized. There is moderate left renal atrophy. There is no hydronephrosis or nephrolithiasis on the left. Stable the left ureter appears unremarkable. There is severe right hydronephrosis with a transition zone in the right ureteropelvic junction. No calculus identified. The right UPJ stricture is not excluded. There is apparent rotation of the bladder wall, likely related to chronic bladder outlet obstruction. The prostate gland is grossly unremarkable with moderate stool noted throughout the colon. There are scattered colonic diverticula without acute inflammatory changes. There is no evidence of bowel obstruction. The appendix appears unremarkable.  There is aortoiliac atherosclerotic disease. There is no retroperitoneal, para-aortic, aortocaval  adenopathy. There is a 11 x 9 cm reason streak mass in the left hemiabdomen in compatible with metastatic disease. There are innumerable omental nodules compatible with metastatic implant. Degenerative changes of the spine. No acute fracture. Check Dr. A 1 cm lucent focus in the left iliac bone lateral to the SI joints (series 2, image 66) is not well characterized but may represent a focal osteopenic area. Lytic metastatic disease is not excluded.  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 This mass may arise from the right kidney, or the right adrenal gland with possible invasion into the liver.  Large soft tissue mass in the left hemiabdomen compatible with metastatic disease. There is extensive omental implants.  Retroperitoneal adenopathy.  Innumerable pulmonary metastatic disease.  No evidence of bowel obstruction.  Severe right hydronephrosis with transition zone at the right UPJ. No stone identified.   Electronically Signed   By: Anner Crete M.D.   On: 12/02/2014 19:02      Assessment and Plan:    81 year old gentleman with the  following issues  1. 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 He presented with a large renal mass measuring 11 x 9 cm with imaging studies confirmed the presence of lymphadenopathy, hepatic metastasis, and lung metastasis. He is status post biopsy on 12/10/2014 with results are currently pending.  These findings were discussed with the patient and his wife today. Regardless of the biopsy results, we are likely dealing with an advanced malignancy that is incurable. Given his debilitated state and the aggressive nature of this malignancy, I see no realistic options to palliate his cancer. I do not see any role for surgery, radiation therapy or systemic therapy.  I feel the supportive care and hospice enrollment is his best option.  I feel that his prognosis is rather poor  with very limited life expectancy probably less than 2 months. I think supportive care only and possible residential hospice might be his best option moving forward.  2. Hydronephrosis: I do not think an intervention would offer much palliation to that hydronephrosis and would probably not address it at this time.  3. Anemia: Likely related to his malignancy as well as chronic disease no intervention is needed.  Once the pathology results are back, I will discuss this further if needed to with any other family members that are not present. I feel that the biopsy results will very unlikely change our discussion today.   I do recommend hospice referral for possible residential hospice placement in the near future.

## 2014-12-15 NOTE — Progress Notes (Signed)
Date:  December 15, 2014 U.R. performed for needs and level of care. Will continue to follow for Case Management needs.  Rhonda Davis, RN, BSN, CCM   [phone] 

## 2014-12-15 NOTE — Progress Notes (Signed)
ANTIBIOTIC CONSULT NOTE - INITIAL  Pharmacy Consult for Vancomycin and Zosyn  Indication: Sepsis  No Known Allergies  Patient Measurements: Height: 6' (182.9 cm) Weight: 153 lb 10.6 oz (69.7 kg) IBW/kg (Calculated) : 77.6 Adjusted Body Weight:   Vital Signs: Temp: 98.7 F (37.1 C) (07/24 2015) Temp Source: Oral (07/24 1706) BP: 115/62 mmHg (07/24 2030) Pulse Rate: 109 (07/24 2030) Intake/Output from previous day:   Intake/Output from this shift:    Labs:  Recent Labs  12/14/14 1710 12/14/14 1711 12/15/14 0102  WBC 25.7*  --  22.9*  HGB 10.1*  --  8.8*  PLT 343  --  271  CREATININE  --  2.75* 2.62*   Estimated Creatinine Clearance: 22.2 mL/min (by C-G formula based on Cr of 2.62). No results for input(s): VANCOTROUGH, VANCOPEAK, VANCORANDOM, GENTTROUGH, GENTPEAK, GENTRANDOM, TOBRATROUGH, TOBRAPEAK, TOBRARND, AMIKACINPEAK, AMIKACINTROU, AMIKACIN in the last 72 hours.   Microbiology: Recent Results (from the past 720 hour(s))  MRSA PCR Screening     Status: None   Collection Time: 12/14/14  9:01 PM  Result Value Ref Range Status   MRSA by PCR NEGATIVE NEGATIVE Final    Comment:        The GeneXpert MRSA Assay (FDA approved for NASAL specimens only), is one component of a comprehensive MRSA colonization surveillance program. It is not intended to diagnose MRSA infection nor to guide or monitor treatment for MRSA infections.     Medical History: Past Medical History  Diagnosis Date  . Hypertension   . Diabetes mellitus without complication   . Gout   . Renal disorder   . CHF (congestive heart failure)     Medications:  Anti-infectives    Start     Dose/Rate Route Frequency Ordered Stop   12/15/14 2200  vancomycin (VANCOCIN) IVPB 750 mg/150 ml premix     750 mg 150 mL/hr over 60 Minutes Intravenous Every 24 hours 12/15/14 0333     12/15/14 0600  piperacillin-tazobactam (ZOSYN) IVPB 3.375 g     3.375 g 12.5 mL/hr over 240 Minutes Intravenous 3  times per day 12/15/14 0333     12/14/14 2030  piperacillin-tazobactam (ZOSYN) IVPB 3.375 g     3.375 g 100 mL/hr over 30 Minutes Intravenous  Once 12/14/14 2021 12/14/14 2330   12/14/14 2030  vancomycin (VANCOCIN) IVPB 1000 mg/200 mL premix     1,000 mg 200 mL/hr over 60 Minutes Intravenous  Once 12/14/14 2021 12/15/14 0000     Assessment: Patient with Sepsis, RLL Consolidation from Post Obstructive Pneumonia. First dose of antibiotics already given in ED.  Patient with poor renal function.   Goal of Therapy:  Vancomycin trough level 15-20 mcg/ml Zosyn based on renal function   Plan:  Measure antibiotic drug levels at steady state Follow up culture results Vancomycin 750mg  iv q24hr  Zosyn 3.375g IV Q8H infused over 4hrs.   Tyler Deis, Shea Stakes [place] 12/15/2014,3:34 AM

## 2014-12-16 DIAGNOSIS — Z515 Encounter for palliative care: Secondary | ICD-10-CM

## 2014-12-16 DIAGNOSIS — Z7189 Other specified counseling: Secondary | ICD-10-CM | POA: Insufficient documentation

## 2014-12-16 DIAGNOSIS — C801 Malignant (primary) neoplasm, unspecified: Secondary | ICD-10-CM

## 2014-12-16 LAB — GLUCOSE, CAPILLARY
GLUCOSE-CAPILLARY: 156 mg/dL — AB (ref 65–99)
GLUCOSE-CAPILLARY: 111 mg/dL — AB (ref 65–99)
GLUCOSE-CAPILLARY: 106 mg/dL — AB (ref 65–99)
GLUCOSE-CAPILLARY: 126 mg/dL — AB (ref 65–99)
Glucose-Capillary: 117 mg/dL — ABNORMAL HIGH (ref 65–99)
Glucose-Capillary: 144 mg/dL — ABNORMAL HIGH (ref 65–99)

## 2014-12-16 LAB — URINE CULTURE

## 2014-12-16 LAB — BASIC METABOLIC PANEL
Anion gap: 11 (ref 5–15)
BUN: 58 mg/dL — ABNORMAL HIGH (ref 6–20)
CALCIUM: 7.5 mg/dL — AB (ref 8.9–10.3)
CO2: 24 mmol/L (ref 22–32)
Chloride: 100 mmol/L — ABNORMAL LOW (ref 101–111)
Creatinine, Ser: 3.41 mg/dL — ABNORMAL HIGH (ref 0.61–1.24)
GFR calc Af Amer: 18 mL/min — ABNORMAL LOW (ref >60)
GFR calc non Af Amer: 16 mL/min — ABNORMAL LOW (ref >60)
Glucose, Bld: 131 mg/dL — ABNORMAL HIGH (ref 65–99)
POTASSIUM: 3.9 mmol/L (ref 3.5–5.1)
Sodium: 135 mmol/L (ref 135–145)

## 2014-12-16 LAB — HEMOGLOBIN A1C
HEMOGLOBIN A1C: 6.6 % — AB (ref 4.8–5.6)
Mean Plasma Glucose: 143 mg/dL

## 2014-12-16 LAB — CBC
HCT: 27.1 % — ABNORMAL LOW (ref 39.0–52.0)
Hemoglobin: 8.5 g/dL — ABNORMAL LOW (ref 13.0–17.0)
MCH: 30.8 pg (ref 26.0–34.0)
MCHC: 31.4 g/dL (ref 30.0–36.0)
MCV: 98.2 fL (ref 78.0–100.0)
Platelets: 304 10*3/uL (ref 150–400)
RBC: 2.76 MIL/uL — ABNORMAL LOW (ref 4.22–5.81)
RDW: 15.9 % — ABNORMAL HIGH (ref 11.5–15.5)
WBC: 19.6 10*3/uL — ABNORMAL HIGH (ref 4.0–10.5)

## 2014-12-16 LAB — VANCOMYCIN, TROUGH: Vancomycin Tr: 19 ug/mL (ref 10.0–20.0)

## 2014-12-16 MED ORDER — PIPERACILLIN-TAZOBACTAM IN DEX 2-0.25 GM/50ML IV SOLN
2.2500 g | Freq: Four times a day (QID) | INTRAVENOUS | Status: DC
  Administered 2014-12-16 – 2014-12-17 (×3): 2.25 g via INTRAVENOUS
  Filled 2014-12-16 (×4): qty 50

## 2014-12-16 NOTE — Progress Notes (Signed)
IP PROGRESS NOTE  Subjective:   Patient appear comfortable this morning and has no complaints. He has not reported any abdominal pain, chest pain or shortness of breath.  Objective:  Vital signs in last 24 hours: Temp:  [97.5 F (36.4 C)-98.2 F (36.8 C)] 97.5 F (36.4 C) (07/26 0800) Pulse Rate:  [48-109] 99 (07/26 1000) Resp:  [11-41] 26 (07/26 1000) BP: (96-111)/(55-87) 96/64 mmHg (07/26 1000) SpO2:  [94 %-99 %] 96 % (07/26 1000) Weight change:     Intake/Output from previous day: 07/25 0701 - 07/26 0700 In: 2300 [P.O.:200; I.V.:1800; IV Piggyback:300] Out: 200 [Urine:200] Alert gentleman chronically ill-appearing without any distress. Mouth: mucous membranes moist, pharynx normal without lesions Resp: clear to auscultation bilaterally Cardio: regular rate and rhythm, S1, S2 normal, no murmur, click, rub or gallop GI: soft, non-tender; bowel sounds normal; no masses,  no organomegaly Extremities: extremities normal, atraumatic, no cyanosis or edema    Lab Results:  Recent Labs  12/15/14 0102 12/16/14 1110  WBC 22.9* 19.6*  HGB 8.8* 8.5*  HCT 27.7* 27.1*  PLT 271 304    BMET  Recent Labs  12/15/14 0102 12/16/14 1110  NA 134* 135  K 3.8 3.9  CL 102 100*  CO2 25 24  GLUCOSE 132* 131*  BUN 51* 58*  CREATININE 2.62* 3.41*  CALCIUM 7.0* 7.5*     Medications: I have reviewed the patient's current medications.  Assessment/Plan:  81 year old gentleman with the following issues  1. 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 He presented with a large renal mass measuring 11 x 9 cm with imaging studies confirmed the presence of lymphadenopathy, hepatic metastasis, and lung metastasis. He is status post biopsy on 12/10/2014 with results showing a poorly differentiated tumor with neuroendocrine features.  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 Although these  tumors are known to be responsive to systemic chemotherapy, I feel that chemotherapy would offer very little palliation of his symptoms. His disease is very advanced and the complications associated with chemotherapy are not justified given his poor performance status.  I recommended hospice care given his poor prognosis and limited life expectancy.  2. Hydronephrosis: I do not think an intervention would offer much palliation to this hydronephrosis and would probably not address it at this time. Although he is a kidney function is deteriorating, percutaneous nephrostomy tubes my not offer extra palliation at this time.  I agree with Palliative Medicine consult and I appreciate their input.    LOS: 2 days   [facility] 12/16/2014, 12:08 PM

## 2014-12-16 NOTE — Progress Notes (Addendum)
ANTIBIOTIC CONSULT NOTE - FOLLOW UP  Pharmacy Consult for Vancomycin, Zosyn Indication: r/o sepsis  No Known Allergies  Patient Measurements: Height: 6' (182.9 cm) Weight: 153 lb 10.6 oz (69.7 kg) IBW/kg (Calculated) : 77.6  Vital Signs: Temp: 97.6 F (36.4 C) (07/26 1200) Temp Source: Oral (07/26 1200) BP: 92/63 mmHg (07/26 1400) Pulse Rate: 101 (07/26 1200) Intake/Output from previous day: 07/25 0701 - 07/26 0700 In: 2300 [P.O.:200; I.V.:1800; IV Piggyback:300] Out: 200 [Urine:200] Intake/Output from this shift: Total I/O In: 775 [P.O.:250; I.V.:525] Out: 200 [Urine:200]  Labs:  Recent Labs  12/14/14 1710 12/14/14 1711 12/15/14 0102 12/16/14 1110  WBC 25.7*  --  22.9* 19.6*  HGB 10.1*  --  8.8* 8.5*  PLT 343  --  271 304  CREATININE  --  2.75* 2.62* 3.41*   Estimated Creatinine Clearance: 17 mL/min (by C-G formula based on Cr of 3.41). No results for input(s): VANCOTROUGH, VANCOPEAK, VANCORANDOM, GENTTROUGH, GENTPEAK, GENTRANDOM, TOBRATROUGH, TOBRAPEAK, TOBRARND, AMIKACINPEAK, AMIKACINTROU, AMIKACIN in the last 72 hours.   Microbiology: Recent Results (from the past 720 hour(s))  Urine culture     Status: None   Collection Time: 12/14/14  8:00 PM  Result Value Ref Range Status   Specimen Description URINE, CATHETERIZED  Final   Special Requests NONE  Final   Culture   Final    MULTIPLE SPECIES PRESENT, SUGGEST RECOLLECTION Performed at [facility]    Report Status 12/16/2014 FINAL  Final  Culture, blood (x 2)     Status: None (Preliminary result)   Collection Time: 12/14/14  8:24 PM  Result Value Ref Range Status   Specimen Description BLOOD RIGHT ARM  Final   Special Requests BOTTLES DRAWN AEROBIC AND ANAEROBIC 10CC AND [place]  Final   Culture   Final    NO GROWTH 1 DAY Performed at [facility]    Report Status PENDING  Incomplete  MRSA PCR Screening     Status: None   Collection Time: 12/14/14  9:01 PM  Result Value Ref Range Status    MRSA by PCR NEGATIVE NEGATIVE Final    Comment:        The GeneXpert MRSA Assay (FDA approved for NASAL specimens only), is one component of a comprehensive MRSA colonization surveillance program. It is not intended to diagnose MRSA infection nor to guide or monitor treatment for MRSA infections.   Culture, blood (x 2)     Status: None (Preliminary result)   Collection Time: 12/14/14 10:00 PM  Result Value Ref Range Status   Specimen Description BLOOD LEFT HAND  Final   Special Requests BOTTLES DRAWN AEROBIC AND ANAEROBIC 10CC AND 6CC  Final   Culture   Final    NO GROWTH 1 DAY Performed at [facility]    Report Status PENDING  Incomplete    Anti-infectives    Start     Dose/Rate Route Frequency Ordered Stop   12/16/14 2200  piperacillin-tazobactam (ZOSYN) IVPB 2.25 g     2.25 g 100 mL/hr over 30 Minutes Intravenous Every 6 hours 12/16/14 1421     12/15/14 2200  vancomycin (VANCOCIN) IVPB 750 mg/150 ml premix     750 mg 150 mL/hr over 60 Minutes Intravenous Every 24 hours 12/15/14 0333     12/15/14 0600  piperacillin-tazobactam (ZOSYN) IVPB 3.375 g  Status:  Discontinued     3.375 g 12.5 mL/hr over 240 Minutes Intravenous 3 times per day 12/15/14 0333 12/16/14 1421   12/14/14 2030  piperacillin-tazobactam (ZOSYN) IVPB 3.375 g     3.375 g 100 mL/hr over 30 Minutes Intravenous  Once 12/14/14 2021 12/14/14 2330   12/14/14 2030  vancomycin (VANCOCIN) IVPB 1000 mg/200 mL premix     1,000 mg 200 mL/hr over 60 Minutes Intravenous  Once 12/14/14 2021 12/15/14 0000     Assessment: 81 yo male presents to ER with abdominal pain, distention, constipation x 3 days. PMH includes DM, sinus tach, abd pain, chronic RF. To start vanc and Zosyn per pharmacy possible sepsis with RLL consolidation from post-obstructive PNA per H&P, but no infiltrates or consolidations on CT/CXR.  PCT elevated; LA resolved Temp: afebrile WBC: elevated, trending down Renal: SCr rising (up 3.4  from 2.11 earlier this month), baseline unclear per records; CrCl 17 CG  7/24 >> vanc >> 7/24 >> Zosyn >>    7/24 blood: ngtd 7/24 urine: ngF MRSA nasal swab: neg   Goal of Therapy:  Vancomycin trough level 15-20 mcg/ml  Eradication of infection Appropriate antibiotic dosing for indication and renal function  Plan:  Day 3 antibiotics  Reduce Zosyn to 2.25 g IV q6 hr with CrCl < 20 ml/min  Continue vancomycin 750 mg IV q24 hr  Check vancomycin trough tonight with SCr rising  Follow clinical course, renal function, culture results as available  Follow for de-escalation of antibiotics and LOT.  Without clear source of infection or fevers and clear cultures, would consider stopping antibiotics soon.   Reuel Boom, PharmD, BCPS Pager: [phone] 12/16/2014, 2:30 PM

## 2014-12-16 NOTE — Clinical Documentation Improvement (Signed)
Possible Clinical Conditions?  Severe Protein Calorie Malnutrition Moderate Mlanutrition Mild Malnutrition Other Condition Cannot clinically determine  Supporting Information: Per H&P: "He has had an unintentional weight loss of 15 pounds over the last month" and "Pleasant Frail Elderly Cachectic 81 y.o. Caucasian male"  Per consult note 7/25: "He reported poor appetite and weight loss" Risk Factors: Per consult note: "Advanced malignancy"  Thank you, Carrolyn Meiers, RN [email or web address] [phone]

## 2014-12-16 NOTE — Consult Note (Signed)
Consultation Note Date: 12/16/2014   Patient Name: Darren Brennan  DOB: 23-Mar-1933  MRN: [id number]  Age / Sex: 81 y.o., male   PCP: Leonard Downing, MD Referring Physician: Velvet Bathe, MD  Reason for Consultation: Establishing goals of care, Inpatient hospice referral and Psychosocial/spiritual support  Palliative Care Assessment and Plan Summary of Established Goals of Care and Medical Treatment Preferences   Clinical Assessment/Narrative: Darren Brennan appears frail and elderly, lying in his bed with his eyes closed.  His wife is at his bedside.  We talk about his current health problems, including his cancer, and SIRS.  He states several times, "I have no regrets" and becomes tearful talking about his wife.  We talk about oncology not being able to offer any treatments at this time, and Darren Brennan tearfully says, "I'm going to die".  He states that he appreciates the honesty.  We talk about Darren Brennan's decline and he feels that he is stronger and able to get himself out of his bed at home, but Darren Brennan states this is not possible.  Darren Brennan talks about his decline since his CT on 7/12 and biopsy on 7/20, that he has been unable to care for himself and his had very poor PO intake with 15 lb weight loss in the last 3-4 weeks.    Darren Brennan states she wants her daughters to be in the conversation regarding Hospice care, and we schedule a meeting at 11 am on 7/27.    Discuss needed regarding the d/c of medications including Vanc and Zoysin, if change to full comfort care and transfer to Hospice.   Contacts/Participants in Discussion: Primary Decision Maker: Darren Brennan is able to make his own decisions, but is lethargic today.     HCPOA:  Darren Brennan states his wife has HCPOA and that he trusts her decisions.   Code Status/Advance Care Planning:  DNR, changed from full code today.    Symptom Management:   Pain: Dilaudid 0.5-1 mg IV Q 3 hours PRN, Oxycodone 5 mg PO Q 4 hours  PRN, effective.  Zofran 4 mg PO/IV.   Maalox/Mylanta Q 6 hours PRN.   Palliative Prophylaxis: None, recommend Senna S 2 tabs, 1-2 times per day.    Psycho-social/Spiritual:   Support System: Wife, Inez Catalina, and 2 daughters.   Desire for further Chaplaincy support:Not discussed today.   Prognosis: 2-4 weeks without agressive treatments.    Discharge Planning:  Darren Brennan desire is to return to his home with Hospice, but this is not likely to be a safe environemtn.        Chief Complaint:  ABD Pain and Swelling History of Present Illness:  Darren Brennan is a 81 y.o. male with a history of CHF, CKD, HTN, Gout, and Recent Biopsy of a Right Renal Mass 5 days ago who present to the ED with complaints of 10/10 generalized dull ABD Pain and Distention, along with nausea, and poor appetite x 5 days. He has had less energy and has been laying in bed and occasionally will get up into a wheelchair per family. He has had an unintentional weight loss of 15 pounds over the last month. He was evaluated in the ED and was found to have an elevated BUN/Cr and a leukocytosis as well as tachycardia, and a Ct of the ABD was performed and revealed a Large Right Flank Mass with Hydronephrosis, hepatic and Lung masses, peritoneal implants a left adrenal mass, as well as increased consolidation of the  right lower lung base.He has been seen by oncology, Dr. Alen Blew, and due to his very advanced disease and the complications associated with chemotherapy, his is not being offered treatment given his poor performance status.    Primary Diagnoses  Present on Admission:  . SIRS (systemic inflammatory response syndrome) . Acute on chronic renal failure . Abdominal pain, generalized . Sinus tachycardia . Leukocytosis . Failure to thrive in adult . Hypotension . Right renal mass . Gout  Palliative Review of Systems: Darren Brennan denies pain, N/v, anxiety.    I have reviewed the medical record, interviewed the  patient and family, and examined the patient. The following aspects are pertinent.  Past Medical History  Diagnosis Date  . Hypertension   . Diabetes mellitus without complication   . Gout   . Renal disorder   . CHF (congestive heart failure)    History   Social History  . Marital Status: Married    Spouse Name: N/A  . Number of Children: N/A  . Years of Education: N/A   Social History Main Topics  . Smoking status: Never Smoker   . Smokeless tobacco: Not on file  . Alcohol Use: No  . Drug Use: No  . Sexual Activity: Not on file   Other Topics Concern  . None   Social History Narrative   History reviewed. No pertinent family history. Scheduled Meds: . allopurinol  100 mg Oral Daily  . enoxaparin (LOVENOX) injection  30 mg Subcutaneous QHS  . feeding supplement (ENSURE ENLIVE)  237 mL Oral TID BM  . insulin aspart  0-9 Units Subcutaneous 6 times per day  . piperacillin-tazobactam (ZOSYN)  IV  2.25 g Intravenous Q6H  . tamsulosin  0.4 mg Oral Daily  . vancomycin  750 mg Intravenous Q24H   Continuous Infusions: . sodium chloride 1,000 mL (12/15/14 1447)   PRN Meds:.acetaminophen **OR** acetaminophen, alum & mag hydroxide-simeth, HYDROmorphone (DILAUDID) injection, ondansetron **OR** ondansetron (ZOFRAN) IV, oxyCODONE, simethicone Medications Prior to Admission:  Prior to Admission medications   Medication Sig Start Date End Date Taking? Authorizing Provider  acetaminophen (TYLENOL) 500 MG tablet Take 500 mg by mouth every 12 (twelve) hours as needed for mild pain, moderate pain or headache.   Yes Historical Provider, MD  acetaminophen-codeine (TYLENOL #3) 300-30 MG per tablet Take 1-2 tablets by mouth every 4 (four) hours as needed for moderate pain.   Yes Historical Provider, MD  allopurinol (ZYLOPRIM) 100 MG tablet Take 100 mg by mouth daily.    Yes Historical Provider, MD  Ca Carbonate-Mag Hydroxide (ROLAIDS PO) Take 1 tablet by mouth daily as needed (indigestion,  heartburn).   Yes Historical Provider, MD  ENSURE (ENSURE) Take 237 mLs by mouth daily as needed (appetitte).    Yes Historical Provider, MD  fluticasone (FLONASE) 50 MCG/ACT nasal spray Place 2 sprays into both nostrils daily as needed for allergies.  10/31/14  Yes Historical Provider, MD  furosemide (LASIX) 40 MG tablet Take 120 mg by mouth daily.    Yes Historical Provider, MD  metoprolol (LOPRESSOR) 50 MG tablet Take 25 mg by mouth daily.   Yes Historical Provider, MD  ondansetron (ZOFRAN) 8 MG tablet Take 8 mg by mouth every 8 (eight) hours as needed for nausea or vomiting.   Yes Historical Provider, MD  pantoprazole (PROTONIX) 40 MG tablet Take 40 mg by mouth daily. 11/10/14  Yes Historical Provider, MD  simethicone (MYLICON) 481 MG chewable tablet Chew 125 mg by mouth every 6 (six) hours  as needed for flatulence.   Yes Historical Provider, MD  tamsulosin (FLOMAX) 0.4 MG CAPS Take 0.4 mg by mouth daily.   Yes Historical Provider, MD   No Known Allergies CBC:    Component Value Date/Time   WBC 19.6* 12/16/2014 1110   WBC 8.2 06/19/2013 0838   HGB 8.5* 12/16/2014 1110   HGB 12.6* 06/19/2013 0838   HCT 27.1* 12/16/2014 1110   HCT 38.2* 06/19/2013 0838   PLT 304 12/16/2014 1110   PLT 178 06/19/2013 0838   MCV 98.2 12/16/2014 1110   MCV 95.5 06/19/2013 0838   NEUTROABS 22.9* 12/14/2014 1710   NEUTROABS 4.7 06/19/2013 0838   LYMPHSABS 1.3 12/14/2014 1710   LYMPHSABS 2.1 06/19/2013 0838   MONOABS 1.5* 12/14/2014 1710   MONOABS 0.7 06/19/2013 0838   EOSABS 0.0 12/14/2014 1710   EOSABS 0.7* 06/19/2013 0838   BASOSABS 0.0 12/14/2014 1710   BASOSABS 0.1 06/19/2013 0838   Comprehensive Metabolic Panel:    Component Value Date/Time   NA 135 12/16/2014 1110   NA 145 06/19/2013 0839   K 3.9 12/16/2014 1110   K 3.7 06/19/2013 0839   CL 100* 12/16/2014 1110   CL 101 08/24/2012 1402   CO2 24 12/16/2014 1110   CO2 29 06/19/2013 0839   BUN 58* 12/16/2014 1110   BUN 29.5* 06/19/2013  0839   CREATININE 3.41* 12/16/2014 1110   CREATININE 2.0* 06/19/2013 0839   GLUCOSE 131* 12/16/2014 1110   GLUCOSE 131 06/19/2013 0839   GLUCOSE 150* 08/24/2012 1402   CALCIUM 7.5* 12/16/2014 1110   CALCIUM 9.7 06/19/2013 0839   AST 81* 12/14/2014 1711   AST 15 06/19/2013 0839   ALT 27 12/14/2014 1711   ALT 13 06/19/2013 0839   ALKPHOS 294* 12/14/2014 1711   ALKPHOS 54 06/19/2013 0839   BILITOT 1.0 12/14/2014 1711   BILITOT 0.37 06/19/2013 0839   PROT 5.8* 12/14/2014 1711   PROT 6.8 06/19/2013 0839   ALBUMIN 2.0* 12/14/2014 1711   ALBUMIN 3.9 06/19/2013 0839    Physical Exam: Vital Signs: BP 92/63 mmHg  Pulse 101  Temp(Src) 97.6 F (36.4 C) (Oral)  Resp 20  Ht 6' (1.829 m)  Wt 69.7 kg (153 lb 10.6 oz)  BMI 20.84 kg/m2  SpO2 96% SpO2: SpO2: 96 % O2 Device: O2 Device: Nasal Cannula O2 Flow Rate: O2 Flow Rate (L/min): 2 L/min Intake/output summary:  Intake/Output Summary (Last 24 hours) at 12/16/14 1438 Last data filed at 12/16/14 1400  Gross per 24 hour  Intake   2300 ml  Output    400 ml  Net   1900 ml   LBM:   Baseline Weight: Weight: 69.7 kg (153 lb 10.6 oz) Most recent weight: Weight: 69.7 kg (153 lb 10.6 oz)  Exam Findings:  Constitutional:  Elderly frail lying in bed, will make but not keep eye contact.  Cardio: No JVD, RRR in 100's.   Resp:even and non labored.  GI:  Soft, no grimace with light palpation.           Palliative Performance Scale:              3 months ago: 70% able to bath self 3 weeks ago: 40% unable to bathe self, very poor PO intake.  Today:  20%   Additional Data Reviewed: Recent Labs     12/15/14  0102  12/16/14  1110  WBC  22.9*  19.6*  HGB  8.8*  8.5*  PLT  271  304  NA  134*  135  BUN  51*  58*  CREATININE  2.62*  3.41*     Time In: 1330 Time Out: 1500 Time Total:  90 minutes Greater than 50%  of this time was spent counseling and coordinating care related to the above assessment and plan. [place] discussion shared  with nursing staff, CM, and Dr.   Weston Brass by: Drue Novel, NP  Drue Novel, NP  12/16/2014, 2:38 PM  Please contact Palliative Medicine Team phone at [phone] for questions and concerns.

## 2014-12-16 NOTE — Progress Notes (Signed)
PHARMACY - VANCOMYCIN (brief note)  Patient on Vancomycin and Zosyn for r/o sepsis (see full note earlier today).  Vancomycin trough level = 19 mcg/ml (goal 15-20 mcg/ml) on Vancomycin 750mg  IV q24h  Scr this AM = 3.41  Plan:  Continue Vancomycin 750mg  IV q24h           Continue Zosyn           F/U AM Scr; may need to adjust Vancomycin regimen if Scr continues to increase  Leone Haven, PharmD 12/16/14 @ 22:17

## 2014-12-16 NOTE — Care Management Important Message (Signed)
Important Message  Patient Details  Name: Darren Brennan MRN: [id number] Date of Birth: 06/12/1932   Medicare Important Message Given:  Yes-second notification given    [name] 12/16/2014, 2:03 [place] Message  Patient Details  Name: Darren Brennan MRN: [id number] Date of Birth: 1932-11-19   Medicare Important Message Given:  Yes-second notification given    [name] 12/16/2014, 2:03 PM

## 2014-12-16 NOTE — Progress Notes (Signed)
[facility] PROGRESS NOTE  Darren Brennan QQI:[id number] DOB: 01/10/1933 DOA: 12/14/2014 PCP: Leonard Downing, MD   Brief Narrative - Pt is a 81 y/o with history of CHF, CKD, HTN, gout, and recent biopsy of a right renal mass 12/10/14 who presented complaining of ABD pain and swelling. Found to have on abdominal CT scan metastatic carcinoma and leukocytosis.  Assessment/Plan:  Principal Problem:   SIRS (systemic inflammatory response syndrome) - continue broad spectrum antibiotics Vanc and Zosyn with low threshold to discontinue - CT scan of abdomen reports large right flank mass, hepatic mass, lung masses, peritoneal implants with left adrenal nodularity. Does not report source of infectious etiology - Chest x-ray does not report any infiltrates or consolidations - UA negative for leukocytes or nitrite  Active Problems:   Diabetes mellitus without complication - Place on diabetic diet - SSI    Gout - stable currently patient is on allopurinol    CHF (congestive heart failure) - Compensated currently    Hypotension - Resolved continue IV fluids for now. - Suspect some of this is secondary to poor oral intake as reported by patient and family    Acute on chronic renal failure - Most likely prerenal etiology given elevated BUN/creatinine ratio will plan on continuing IV fluid rehydration  Hyponatremia - Secondary to poor oral solute intake. Continue normal saline administration. BMP pending    Abdominal pain, generalized  - Most likely 2ary to metastatic carcinoma. Dr. Alen Blew has evaluated and recommended palliative care for hospice placement as currently he suspects patient my have 2 months left. Discussed with spouse at bedside who is agreeable to meet with palliative care team.   Code Status: full Family Communication: None at bedside Disposition Plan: Pending further improvement in condition. D/c oncologist   Consultants:  Oncology: Dr.  Alen Blew  Palliative care    Procedures:  none  Antibiotics:  Vancomycin and zosyn  HPI/Subjective: Pain currently well controlled.  Objective: Filed Vitals:   12/16/14 0800  BP: 105/59  Pulse: 97  Temp: 97.5 F (36.4 C)  Resp: 11    Intake/Output Summary (Last 24 hours) at 12/16/14 0948 Last data filed at 12/16/14 0800  Gross per 24 hour  Intake   2225 ml  Output    200 ml  Net   2025 ml   Filed Weights   12/15/14 0000  Weight: 69.7 kg (153 lb 10.6 oz)    Exam:   General:  Pt in nad, alert and awake  Cardiovascular: rrr, no mrg  Respiratory: cta bl, no wheezes  Abdomen: soft, generalized discomfort with deep palpation., no guarding.  Musculoskeletal: no cyanosis or clubbing   Data Reviewed: Basic Metabolic Panel:  Recent Labs Lab 12/14/14 1711 12/15/14 0102  NA 136 134*  K 4.4 3.8  CL 98* 102  CO2 27 25  GLUCOSE 123* 132*  BUN 52* 51*  CREATININE 2.75* 2.62*  CALCIUM 7.9* 7.0*   Liver Function Tests:  Recent Labs Lab 12/14/14 1711  AST 81*  ALT 27  ALKPHOS 294*  BILITOT 1.0  PROT 5.8*  ALBUMIN 2.0*    Recent Labs Lab 12/14/14 1711  LIPASE 17*   No results for input(s): AMMONIA in the last 168 hours. CBC:  Recent Labs Lab 12/10/14 0947 12/14/14 1710 12/15/14 0102  WBC 17.5* 25.7* 22.9*  NEUTROABS  --  22.9*  --   HGB 10.0* 10.1* 8.8*  HCT 31.5* 31.8* 27.7*  MCV 96.0 98.5 96.5  PLT 340 343 271   Cardiac  Enzymes: No results for input(s): CKTOTAL, CKMB, CKMBINDEX, TROPONINI in the last 168 hours. BNP (last 3 results) No results for input(s): BNP in the last 8760 hours.  ProBNP (last 3 results) No results for input(s): PROBNP in the last 8760 hours.  CBG:  Recent Labs Lab 12/15/14 1559 12/15/14 2017 12/15/14 2359 12/16/14 0320 12/16/14 0749  GLUCAP 148* 108* 144* 156* 111*    Recent Results (from the past 240 hour(s))  MRSA PCR Screening     Status: None   Collection Time: 12/14/14  9:01 PM  Result  Value Ref Range Status   MRSA by PCR NEGATIVE NEGATIVE Final    Comment:        The GeneXpert MRSA Assay (FDA approved for NASAL specimens only), is one component of a comprehensive MRSA colonization surveillance program. It is not intended to diagnose MRSA infection nor to guide or monitor treatment for MRSA infections.      Studies: Ct Abdomen Pelvis Wo Contrast  12/14/2014   CLINICAL DATA:  Generalized abdominal pain, status post renal biopsy 5 days ago  EXAM: CT ABDOMEN AND PELVIS WITHOUT CONTRAST  TECHNIQUE: Multidetector CT imaging of the abdomen and pelvis was performed following the standard protocol without IV contrast.  COMPARISON:  12/10/2014, 12/02/2014  FINDINGS: Innumerable round bilateral lung masses unchanged. Increased consolidation right lung base. New small right pleural effusion.  Large right abdominal mass in the vicinity of the right kidney right adrenal gland and right lobe of the liver which may arise from any of the structures with involvement or invasion of the others. 2.5 cm mass adjacent to this in the inferior right lobe of the liver also stable. Gallbladder within normal limits. Dilated right renal pelvis unchanged.  Pancreas normal except for displacement by mass effect related to the mass. Spleen normal. Left adrenal gland nodularity stable. Large mass posterior and inferior to the stomach measures up to 7.5 cm, unchanged. Numerous enlarged peritoneal lymph nodes/implants stable. Right flank peritoneal implant measures 5.5 cm. Just to the right of midline is another peritoneal implant measuring 3.8 cm. In the left flank there is a large soft tissue mass measuring 13 x 12 cm. Stable retroperitoneal periaortic adenopathy.  Small volume ascites inferior to the liver similar to prior study.  Mild enlargement of the prostate. Bladder is normal. No acute abnormality involving bowel or bladder.  IMPRESSION: Large right flank mass without significant interval change  consistent with malignancy. Hepatic mass, lung masses, and peritoneal implants with left adrenal nodularity and enlarged abdominal lymph nodes all without significant interval change.  Right hydronephrosis with transition at the ureteropelvic junction unchanged.   Electronically Signed   By: Skipper Cliche M.D.   On: 12/14/2014 18:45   Dg Chest Port 1 View  12/14/2014   CLINICAL DATA:  Shortness of breath with sepsis.  Renal mass  EXAM: PORTABLE CHEST - 1 VIEW  COMPARISON:  Chest radiograph April 04, 2014  FINDINGS: There are nodular lesions throughout the lungs diffusely ranging in size from as small as 3 mm to as large as 1.5 cm. None of these nodular lesions appear cavitated. The heart size and pulmonary vascular normal. No adenopathy apparent. No bone lesions.  IMPRESSION: Diffuse nodular opacities throughout the lungs. Suspect widespread metastatic disease. Septic emboli are possible, although lack of cavitation of any of these nodular lesions makes widespread metastatic disease more likely.   Electronically Signed   By: Lowella Grip III M.D.   On: 12/14/2014 20:42    Scheduled  Meds: . allopurinol  100 mg Oral Daily  . enoxaparin (LOVENOX) injection  30 mg Subcutaneous QHS  . feeding supplement (ENSURE ENLIVE)  237 mL Oral TID BM  . insulin aspart  0-9 Units Subcutaneous 6 times per day  . piperacillin-tazobactam (ZOSYN)  IV  3.375 g Intravenous 3 times per day  . tamsulosin  0.4 mg Oral Daily  . vancomycin  750 mg Intravenous Q24H   Continuous Infusions: . sodium chloride 1,000 mL (12/15/14 1447)    Time spent: > 35 minutes    Velvet Bathe  [facility] Pager [phone]. If 7PM-7AM, please contact night-coverage at [email or web address], password [facility] 12/16/2014, 9:48 AM  LOS: 2 days

## 2014-12-17 LAB — GLUCOSE, CAPILLARY
GLUCOSE-CAPILLARY: 113 mg/dL — AB (ref 65–99)
GLUCOSE-CAPILLARY: 126 mg/dL — AB (ref 65–99)
GLUCOSE-CAPILLARY: 141 mg/dL — AB (ref 65–99)
Glucose-Capillary: 113 mg/dL — ABNORMAL HIGH (ref 65–99)
Glucose-Capillary: 126 mg/dL — ABNORMAL HIGH (ref 65–99)
Glucose-Capillary: 119 mg/dL — ABNORMAL HIGH (ref 65–99)

## 2014-12-17 MED ORDER — LORAZEPAM 0.5 MG PO TABS
0.2500 mg | ORAL_TABLET | Freq: Two times a day (BID) | ORAL | Status: DC
  Administered 2014-12-17 (×2): 0.25 mg via ORAL
  Filled 2014-12-17 (×2): qty 1

## 2014-12-17 MED ORDER — AMOXICILLIN-POT CLAVULANATE 500-125 MG PO TABS
1.0000 | ORAL_TABLET | Freq: Two times a day (BID) | ORAL | Status: DC
  Administered 2014-12-17 – 2014-12-18 (×3): 500 mg via ORAL
  Filled 2014-12-17 (×4): qty 1

## 2014-12-17 MED ORDER — SODIUM CHLORIDE 0.9 % IV SOLN
INTRAVENOUS | Status: DC
  Administered 2014-12-17 – 2014-12-18 (×2): via INTRAVENOUS

## 2014-12-17 MED ORDER — SENNOSIDES-DOCUSATE SODIUM 8.6-50 MG PO TABS
1.0000 | ORAL_TABLET | Freq: Every day | ORAL | Status: DC
  Administered 2014-12-17: 1 via ORAL
  Filled 2014-12-17 (×2): qty 1

## 2014-12-17 MED ORDER — AMOXICILLIN-POT CLAVULANATE 875-125 MG PO TABS
1.0000 | ORAL_TABLET | Freq: Two times a day (BID) | ORAL | Status: DC
  Filled 2014-12-17 (×2): qty 1

## 2014-12-17 NOTE — Progress Notes (Signed)
Initial Nutrition Assessment  DOCUMENTATION CODES:   Severe malnutrition in context of chronic illness  INTERVENTION:     NUTRITION DIAGNOSIS:   Increased nutrient needs related to catabolic illness, cancer and cancer related treatments as evidenced by estimated needs  GOAL:   Patient will meet greater than or equal to 90% of their needs  MONITOR:   PO intake, Supplement acceptance, Weight trends, Labs  REASON FOR ASSESSMENT:   Malnutrition Screening Tool  ASSESSMENT:  81 y.o. male with a history of CHF, CKD, HTN, Gout, and Recent Biopsy of a Right Renal Mass 5 days ago who present to the ED with complaints of 10/10 generalized dull ABD Pain and Distention, along with nausea, and poor appetite x 5 days. He has had less energy and has been laying in bed and occasionally will get up into a wheelchair per family. He has had an unintentional weight loss of 15 pounds over the last month.  Pt seen for MST. BMI indicates normal weight status. Pt very HOH and unable to have meaningful conversation with him at time of visit; no family or visitors present at that time. He ate 5% of lunch yesterday and 25% of breakfast this AM per chart review.  Per H&P note, pt lost 15 lbs in the past month which would indicate 9% body weight loss in this time frame which is significant. Ensure Enlive has been ordered BID. Noted moderate and severe muscle and moderate and severe fat wasting.  Not meeting needs. Palliative care note yesterday indicated pt's wishes to d/c home with hospice. Medications reviewed. Labs reviewed; CBGs: 95-156 mg/dL, Cl: 100 mmol/L, BUN/creatinine elevated, Ca: 7.5 mg/dL, GFR: 16.   Diet Order:  Diet Carb Modified Fluid consistency:: Thin; Room service appropriate?: Yes  Skin:  Wound (see comment) (L flank wound)  Last BM:  PTA  Height:   Ht Readings from Last 1 Encounters:  12/15/14 6' (1.829 m)    Weight:   Wt Readings from Last 1 Encounters:  12/15/14 153 lb  10.6 oz (69.7 kg)    Ideal Body Weight:  80.91 kg (kg)  BMI:  Body mass index is 20.84 kg/(m^2).  Estimated Nutritional Needs:   Kcal:  2050-2250  Protein:  80-95 grams  Fluid:  2.2-2.5 L/day  EDUCATION NEEDS:   No education needs identified at this time     Jarome Matin, RD, LDN Inpatient Clinical Dietitian Pager # [phone] After hours/weekend pager # [phone]

## 2014-12-17 NOTE — Progress Notes (Signed)
[id number] of the [place] will call the family in room 1519 and Diane will come to see/Rhonda Davis,RN,BSN,CCM

## 2014-12-17 NOTE — Progress Notes (Signed)
[facility] PROGRESS NOTE  Darren Brennan JXB:[id number] DOB: [birth year] DOA: 12/14/2014 PCP: Leonard Downing, MD   Brief Narrative - Pt is a 81 y/o with history of CHF, CKD, HTN, gout, and recent biopsy of a right renal mass 12/10/14 who presented complaining of ABD pain and swelling. Found to have on abdominal CT scan metastatic carcinoma and leukocytosis. Oncology consulted and recommended hospice.  Palliative consulted.  Assessment/Plan:  Principal Problem:   SIRS (systemic inflammatory response syndrome) - No clear source of infection identified. Blood culture reporting no growth and urine culture inconclusive. Leukocytosis hours trending down on broad spectrum antibiotics vancomycin and Zosyn. I will narrow antibiotics regimen to Augmentin - CT scan of abdomen reports large right flank mass, hepatic mass, lung masses, peritoneal implants with left adrenal nodularity. Does not report source of infectious etiology - Chest x-ray does not report any infiltrates or consolidations - UA negative for leukocytes or nitrite   Acute on chronic renal failure -We'll discontinue vancomycin, and given elevated BUN/creatinine ratio will increase normal saline infusion to 100 mL's per hour  Active Problems:   Diabetes mellitus without complication - Place on diabetic diet - SSI    Gout - stable currently patient is on allopurinol    CHF (congestive heart failure) - Compensated currently    Hypotension - Resolved continue IV fluids for now. - Suspect some of this is secondary to poor oral intake as reported by patient and family    Acute on chronic renal failure - Most likely prerenal etiology given elevated BUN/creatinine ratio will plan on continuing IV fluid rehydration  Hyponatremia - Secondary to poor oral solute intake. Continue normal saline administration. BMP pending    Abdominal pain, generalized  - Most likely 2ary to metastatic carcinoma. Dr. Alen Blew has evaluated  and recommended palliative care for hospice placement as currently he suspects patient my have 2 months left.  - Palliative care team consulted and assisting. Would like to thank them further help. At this juncture assessing whether patient will qualify for residential hospice versus hospice at home   Code Status: full Family Communication: None at bedside Disposition Plan: Please see above   Consultants:  Oncology: Dr. Alen Blew  Palliative care    Procedures:  none  Antibiotics:  Vancomycin and zosyn>>>7/27  augmentin  HPI/Subjective: No new complaints.  Objective: Filed Vitals:   12/17/14 1053  BP: 111/65  Pulse: 97  Temp: 97.7 F (36.5 C)  Resp: 20    Intake/Output Summary (Last 24 hours) at 12/17/14 1149 Last data filed at 12/17/14 1000  Gross per 24 hour  Intake   2100 ml  Output    125 ml  Net   1975 ml   Filed Weights   12/15/14 0000  Weight: 69.7 kg (153 lb 10.6 oz)    Exam:   General:  Pt in nad, alert and awake  Cardiovascular: rrr, no mrg  Respiratory: cta bl, no wheezes  Abdomen: soft, generalized discomfort with deep palpation., no guarding.  Musculoskeletal: no cyanosis or clubbing   Data Reviewed: Basic Metabolic Panel:  Recent Labs Lab 12/14/14 1711 12/15/14 0102 12/16/14 1110  NA 136 134* 135  K 4.4 3.8 3.9  CL 98* 102 100*  CO2 27 25 24   GLUCOSE 123* 132* 131*  BUN 52* 51* 58*  CREATININE 2.75* 2.62* 3.41*  CALCIUM 7.9* 7.0* 7.5*   Liver Function Tests:  Recent Labs Lab 12/14/14 1711  AST 81*  ALT 27  ALKPHOS 294*  BILITOT 1.0  PROT 5.8*  ALBUMIN 2.0*    Recent Labs Lab 12/14/14 1711  LIPASE 17*   No results for input(s): AMMONIA in the last 168 hours. CBC:  Recent Labs Lab 12/14/14 1710 12/15/14 0102 12/16/14 1110  WBC 25.7* 22.9* 19.6*  NEUTROABS 22.9*  --   --   HGB 10.1* 8.8* 8.5*  HCT 31.8* 27.7* 27.1*  MCV 98.5 96.5 98.2  PLT 343 271 304   Cardiac Enzymes: No results for input(s):  CKTOTAL, CKMB, CKMBINDEX, TROPONINI in the last 168 hours. BNP (last 3 results) No results for input(s): BNP in the last 8760 hours.  ProBNP (last 3 results) No results for input(s): PROBNP in the last 8760 hours.  CBG:  Recent Labs Lab 12/16/14 1145 12/16/14 1535 12/16/14 2021 12/17/14 0039 12/17/14 0355  GLUCAP 117* 106* 126* 113* 113*    Recent Results (from the past 240 hour(s))  Urine culture     Status: None   Collection Time: 12/14/14  8:00 PM  Result Value Ref Range Status   Specimen Description URINE, CATHETERIZED  Final   Special Requests NONE  Final   Culture   Final    MULTIPLE SPECIES PRESENT, SUGGEST RECOLLECTION Performed at [facility]    Report Status 12/16/2014 FINAL  Final  Culture, blood (x 2)     Status: None (Preliminary result)   Collection Time: 12/14/14  8:24 PM  Result Value Ref Range Status   Specimen Description BLOOD RIGHT ARM  Final   Special Requests BOTTLES DRAWN AEROBIC AND ANAEROBIC 10CC AND [place]  Final   Culture   Final    NO GROWTH 1 DAY Performed at [facility]    Report Status PENDING  Incomplete  MRSA PCR Screening     Status: None   Collection Time: 12/14/14  9:01 PM  Result Value Ref Range Status   MRSA by PCR NEGATIVE NEGATIVE Final    Comment:        The GeneXpert MRSA Assay (FDA approved for NASAL specimens only), is one component of a comprehensive MRSA colonization surveillance program. It is not intended to diagnose MRSA infection nor to guide or monitor treatment for MRSA infections.   Culture, blood (x 2)     Status: None (Preliminary result)   Collection Time: 12/14/14 10:00 PM  Result Value Ref Range Status   Specimen Description BLOOD LEFT HAND  Final   Special Requests BOTTLES DRAWN AEROBIC AND ANAEROBIC 10CC AND [place]  Final   Culture   Final    NO GROWTH 1 DAY Performed at [facility]    Report Status PENDING  Incomplete     Studies: No results found.  Scheduled Meds: .  allopurinol  100 mg Oral Daily  . enoxaparin (LOVENOX) injection  30 mg Subcutaneous QHS  . feeding supplement (ENSURE ENLIVE)  237 mL Oral TID BM  . insulin aspart  0-9 Units Subcutaneous 6 times per day  . piperacillin-tazobactam (ZOSYN)  IV  2.25 g Intravenous Q6H  . tamsulosin  0.4 mg Oral Daily  . vancomycin  750 mg Intravenous Q24H   Continuous Infusions: . sodium chloride 75 mL/hr at 12/17/14 0500    Time spent: > 35 minutes    Velvet Bathe  [facility] Pager [phone]. If 7PM-7AM, please contact night-coverage at [email or web address], password [facility] 12/17/2014, 11:49 AM  LOS: 3 days

## 2014-12-17 NOTE — Clinical Documentation Improvement (Signed)
Possible Clinical Conditions? (please answer in your progress notes and discharge summary)  Septicemia / Sepsis Severe Sepsis  SIRS and Sepsis SIRS only Other Condition  Cannot clinically Determine   Risk Factors: WBC 25.7, 22.9, 19.6 Cr 2.62, 3.41 Lactic acid 2.1, 2.1, 1.5 Heart rate in ED 118.  Treatment: IV Vancomycin and Zosyn, IVF  Thank You,  Carrolyn Meiers, RN [email or web address] [phone] :

## 2014-12-17 NOTE — Progress Notes (Signed)
Daily Progress Note   Patient Name: Darren Brennan       Date: 12/17/2014 DOB: [birth year]  Age: 81 y.o. MRN#: [id number] Attending Physician: Velvet Bathe, MD Primary Care Physician: Leonard Downing, MD Admit Date: 12/14/2014  Reason for Consultation/Follow-up: GOC  Subjective:     I spent much time with Darren Brennan, Darren Brennan, and two daughters. Answered many questions about hospice from family (including why IV fluids is not used at end of life). Explained the philosophy of hospice and comfort care and also expectations at end of life, comfort feedings. They seem very understanding. He is very hard of hearing and easily off topic - very difficult for me to answer their questions as he constantly interrupting and stuck on topics (such as worried about his tenants). Of note, Darren Brennan says that she does NOT want him to die at home and is interested in hospice facility eventually. Prognosis is very poor with very poor appetite and worsening renal function. They are worried about his anxiety and indigestion.    Length of Stay: 3 days  Current Medications: Scheduled Meds:  . allopurinol  100 mg Oral Daily  . enoxaparin (LOVENOX) injection  30 mg Subcutaneous QHS  . feeding supplement (ENSURE ENLIVE)  237 mL Oral TID BM  . insulin aspart  0-9 Units Subcutaneous 6 times per day  . piperacillin-tazobactam (ZOSYN)  IV  2.25 g Intravenous Q6H  . tamsulosin  0.4 mg Oral Daily  . vancomycin  750 mg Intravenous Q24H    Continuous Infusions: . sodium chloride 75 mL/hr at 12/17/14 0500    PRN Meds: acetaminophen **OR** acetaminophen, alum & mag hydroxide-simeth, HYDROmorphone (DILAUDID) injection, ondansetron **OR** ondansetron (ZOFRAN) IV, oxyCODONE, simethicone  Palliative Performance Scale: 20-30%     Vital Signs: BP 125/70 mmHg  Pulse 101  Temp(Src) 97.4 F (36.3 C) (Axillary)  Resp 18  Ht 6' (1.829 m)  Wt 69.7 kg (153 lb 10.6 oz)  BMI 20.84 kg/m2  SpO2 96% SpO2:  SpO2: 96 % O2 Device: O2 Device: Nasal Cannula O2 Flow Rate: O2 Flow Rate (L/min): 2 L/min  Intake/output summary:  Intake/Output Summary (Last 24 hours) at 12/17/14 1021 Last data filed at 12/17/14 1000  Gross per 24 hour  Intake   2175 ml  Output    125 ml  Net   2050 ml   LBM: Last BM Date:  (PTA) Baseline Weight: Weight: 69.7 kg (153 lb 10.6 oz) Most recent weight: Weight: 69.7 kg (153 lb 10.6 oz)  Physical Exam: General: NAD, thin, frail, ill appearing CVS: RRR Resp: No labored breathing Abd: Soft, NT, ND Neuro: Awake, alert, oriented to person   Additional Data Reviewed: Recent Labs     12/15/14  0102  12/16/14  1110  WBC  22.9*  19.6*  HGB  8.8*  8.5*  PLT  271  304  NA  134*  135  BUN  51*  58*  CREATININE  2.62*  3.41*     Problem List:  Patient Active Problem List   Diagnosis Date Noted  . Palliative care encounter 12/16/2014  . DNR (do not resuscitate) discussion   . Abdominal pain, generalized 12/14/2014  . Sinus tachycardia 12/14/2014  . Leukocytosis 12/14/2014  . Failure to thrive in adult 12/14/2014  . Right renal mass 12/14/2014  . Abdominal pain   . Adenopathy   . Acute on chronic renal failure 08/11/2012  . Anemia of chronic disease 08/11/2012  . Fever 08/10/2012  . SIRS (  systemic inflammatory response syndrome) 08/10/2012  . Hypotension 08/10/2012  . Diabetes mellitus without complication   . Gout   . CHF (congestive heart failure)   . Hypertension      Palliative Care Assessment & Plan    Code Status:  DNR  Goals of Care:  Goal is for comfort focus and to return home. Do not want him to die in the home.   3. Symptom Management:  Abd pain: Oxycodone 5 mg every 4 hours prn.   Anxiety: Trial of Ativan 0.25 mg BID. May need increased dose qhs - will reassess tomorrow.   Indigestion: Maalox/mylanta every 6 hours prn.   4. Palliative Prophylaxis:  Stool Softner: Senokot-S 1 tablet qhs.   5. Prognosis: Weeks to months.     5. Discharge Planning: Home with hospice with transition to hospice facility.    Thank you for allowing the Palliative Medicine Team to assist in the care of this patient.   Time In: 1050 Time Out: 1210 Total Time 45min Prolonged Time Billed  no     Greater than 50%  of this time was spent counseling and coordinating care related to the above assessment and plan.   Vinie Sill, NP Palliative Medicine Team Pager # [phone] (M-F 8a-5p) Team Phone # [phone] (Nights/Weekends)  12/17/2014, 10:21 AM

## 2014-12-18 ENCOUNTER — Ambulatory Visit: Payer: Medicare Other | Admitting: Oncology

## 2014-12-18 DIAGNOSIS — E43 Unspecified severe protein-calorie malnutrition: Secondary | ICD-10-CM | POA: Insufficient documentation

## 2014-12-18 LAB — GLUCOSE, CAPILLARY
GLUCOSE-CAPILLARY: 108 mg/dL — AB (ref 65–99)
Glucose-Capillary: 110 mg/dL — ABNORMAL HIGH (ref 65–99)
Glucose-Capillary: 112 mg/dL — ABNORMAL HIGH (ref 65–99)
Glucose-Capillary: 121 mg/dL — ABNORMAL HIGH (ref 65–99)
Glucose-Capillary: 97 mg/dL (ref 65–99)

## 2014-12-18 MED ORDER — DOCUSATE SODIUM 50 MG PO CAPS: 50.0000 mg | ORAL_CAPSULE | Freq: Every day | ORAL | Status: AC | PRN

## 2014-12-18 MED ORDER — LORAZEPAM 0.5 MG PO TABS: 0.2500 mg | ORAL_TABLET | Freq: Two times a day (BID) | ORAL | Status: AC | PRN

## 2014-12-18 MED ORDER — LORAZEPAM 0.5 MG PO TABS: 0.2500 mg | ORAL_TABLET | Freq: Every day | ORAL | Status: DC | PRN

## 2014-12-18 MED ORDER — ONDANSETRON HCL 8 MG PO TABS: 8.0000 mg | ORAL_TABLET | Freq: Three times a day (TID) | ORAL | Status: AC | PRN

## 2014-12-18 MED ORDER — OXYCODONE HCL 5 MG PO TABS: 5.0000 mg | ORAL_TABLET | ORAL | Status: AC | PRN

## 2014-12-18 MED ORDER — AMOXICILLIN-POT CLAVULANATE 500-125 MG PO TABS: 1.0000 | ORAL_TABLET | Freq: Two times a day (BID) | ORAL | Status: AC

## 2014-12-18 MED ORDER — LORAZEPAM 0.5 MG PO TABS: 0.2500 mg | ORAL_TABLET | Freq: Every day | ORAL | Status: DC

## 2014-12-18 NOTE — Progress Notes (Signed)
Clinical Social Work  Patient plans to DC home with hospice. Hospice is arranged to meet with patient at his home at 6 pm tonight. CSW verified address and prepared PTAR forms and transportation was arranged for 5pm pick up. PTAR forms and DNR on chart. CSW is signing off but available if needed.  PTAR request #: [id number].  CSW is signing off but available if needed.  [place], [place] [phone]

## 2014-12-18 NOTE — Discharge Summary (Signed)
Physician Discharge Summary  Darren Brennan:[id number] DOB: Apr 28, 1933 DOA: 12/14/2014  PCP: Leonard Downing, MD  Admit date: 12/14/2014 Discharge date: 12/18/2014  Recommendations for Outpatient Follow-up:  1. Continue Augmentin as prescribed  2. Follow up with PCP per scheduled appt   Discharge Diagnoses:  Principal Problem:   SIRS (systemic inflammatory response syndrome) Active Problems:   Diabetes mellitus without complication   Gout   CHF (congestive heart failure)   Hypotension   Acute on chronic renal failure   Abdominal pain, generalized   Sinus tachycardia   Leukocytosis   Failure to thrive in adult   Right renal mass   Abdominal pain   Palliative care encounter   DNR (do not resuscitate) discussion   Protein-calorie malnutrition, severe    Discharge Condition: stable   Diet recommendation: as tolerated   History of present illness:  81 y/o male with past medical history of CHF, CKD, HTN, gout, recent biopsy of a right renal mass 12/10/14 who presented to [facility] ED with reports of abdominal pain and swelling. On CT scan he was found to have metastatic carcinoma. Oncology recommended hospice care.   Hospital Course:   Assessment/Plan:  Principal Problem:  SIRS (systemic inflammatory response syndrome) / Leukocytosis  - SIRS criteria met but no clear source of infection identified. Blood cultures so far show no growth. Urine cultures are inconclusive. - Pt on broad spectrum abx and will continue Augmentin on discharge as prescribed. - CXR with no acute cardiopulmonary process.   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 Alen Blew  Palliative care  Procedures:  none  Antibiotics:  Vancomycin and zosyn>>>7/27  augmentin   Signed:  Leisa Lenz, MD  [facility] 12/18/2014, 10:49 AM  Pager #: [phone]  Time spent in minutes: more than 30 minutes   Discharge Exam: Filed Vitals:   12/18/14 1005  BP: 112/73  Pulse: 102  Temp: 97.7 F (36.5 C)  Resp: 16   Filed Vitals:   12/17/14 2005 12/18/14 0155 12/18/14 0600 12/18/14 1005  BP: 112/68 117/73 118/74 112/73  Pulse: 104 103 102 102  Temp: 97.9 F (36.6 C) 97.3 F (36.3 C) 98.1 F (36.7 C) 97.7 F (36.5 C)  TempSrc: Oral Oral Oral Oral  Resp: 18 16 16 16   Height:      Weight:      SpO2: 97% 98% 97% 96%    General: Pt is alert, follows commands appropriately, not in acute distress Cardiovascular: Regular rate and rhythm, S1/S2 +, no murmurs Respiratory: Clear to auscultation bilaterally, no wheezing, no crackles, no rhonchi Abdominal: Soft, non tender, non distended, bowel sounds +, no guarding Extremities: no edema, no cyanosis, pulses palpable bilaterally DP and PT Neuro: Grossly nonfocal  Discharge Instructions  Discharge Instructions    Call MD for:  difficulty breathing, headache  or visual disturbances    Complete by:  As directed      Call MD for:  persistant nausea and vomiting    Complete by:  As directed      Call MD for:  severe uncontrolled pain    Complete by:  As directed      Diet - low sodium heart healthy    Complete by:  As directed      Increase activity slowly    Complete by:  As directed             Medication List    STOP taking these  medications        acetaminophen 500 MG tablet  Commonly known as:  TYLENOL     furosemide 40 MG tablet  Commonly known as:  LASIX     metoprolol 50 MG tablet  Commonly known as:  LOPRESSOR     pantoprazole 40 MG tablet  Commonly known as:  PROTONIX     simethicone 125 MG chewable tablet  Commonly known as:  MYLICON      TAKE these medications        acetaminophen-codeine 300-30 MG per tablet  Commonly known as:  TYLENOL #3  Take 1-2 tablets by mouth every 4 (four) hours as needed for moderate pain.     allopurinol 100 MG tablet  Commonly known as:  ZYLOPRIM  Take 100 mg by mouth daily.     amoxicillin-clavulanate 500-125 MG per tablet  Commonly known as:  AUGMENTIN  Take 1 tablet (500 mg total) by mouth 2 (two) times daily.     docusate sodium 50 MG capsule  Commonly known as:  COLACE  Take 1 capsule (50 mg total) by mouth daily as needed for mild constipation.     ENSURE  Take 237 mLs by mouth daily as needed (appetitte).     fluticasone 50 MCG/ACT nasal spray  Commonly known as:  FLONASE  Place 2 sprays into both nostrils daily as needed for allergies.     LORazepam 0.5 MG tablet  Commonly known as:  ATIVAN  Take 0.5 tablets (0.25 mg total) by mouth every 12 (twelve) hours as needed for anxiety.  Start taking on:  12/19/2014     ondansetron 8 MG tablet  Commonly known as:  ZOFRAN  Take 1 tablet (8 mg total) by mouth every 8 (eight) hours as needed for nausea or vomiting.     oxyCODONE 5 MG immediate release tablet  Commonly known as:  Oxy IR/ROXICODONE  Take 1 tablet (5 mg total) by mouth every 4 (four) hours as needed for moderate pain.     ROLAIDS PO  Take 1 tablet by mouth daily as needed (indigestion, heartburn).     tamsulosin 0.4 MG Caps capsule  Commonly known as:  FLOMAX  Take 0.4 mg by mouth daily.           Follow-up Information    Follow up with Leonard Downing, MD. Schedule an appointment as soon as possible for a visit in 1 week.    Specialty:  Family Medicine   Why:  Follow up appt after recent hospitalization   Contact information:   [place] [id number] [phone]        The results of significant diagnostics from this hospitalization (including imaging, microbiology, ancillary and laboratory) are listed below for reference.    Significant Diagnostic Studies: Ct Abdomen Pelvis Wo Contrast  12/14/2014   CLINICAL DATA:  Generalized  abdominal pain, status post renal biopsy 5 days ago  EXAM: CT ABDOMEN AND PELVIS WITHOUT CONTRAST  TECHNIQUE: Multidetector CT imaging of the abdomen and pelvis was performed following the standard protocol without IV contrast.  COMPARISON:  12/10/2014, 12/02/2014  FINDINGS: Innumerable round bilateral lung masses unchanged. Increased consolidation right lung base. New small right pleural effusion.  Large right abdominal mass in the vicinity of the right kidney right adrenal gland and right lobe of the liver which may arise from any of the structures with involvement or invasion of the others. 2.5 cm mass adjacent to this in the inferior right lobe of the liver also stable. Gallbladder within normal limits. Dilated right renal pelvis unchanged.  Pancreas normal except for displacement by mass effect related to the mass. Spleen normal. Left adrenal gland nodularity stable. Large mass posterior and inferior to the stomach measures up to 7.5 cm, unchanged. Numerous enlarged peritoneal lymph nodes/implants stable. Right flank peritoneal implant measures 5.5 cm. Just to the right of midline is another peritoneal implant measuring 3.8 cm. In the left flank there is a large soft tissue mass measuring 13 x 12 cm. Stable retroperitoneal periaortic adenopathy.  Small volume ascites inferior to the liver similar to prior study.  Mild enlargement of the prostate. Bladder is normal. No acute abnormality involving bowel or bladder.  IMPRESSION: Large right flank mass without significant  interval change consistent with malignancy. Hepatic mass, lung masses, and peritoneal implants with left adrenal nodularity and enlarged abdominal lymph nodes all without significant interval change.  Right hydronephrosis with transition at the ureteropelvic junction unchanged.   Electronically Signed   By: Skipper Cliche M.D.   On: 12/14/2014 18:45   Ct Abdomen Pelvis Wo Contrast  12/02/2014   CLINICAL DATA:  81 year old male with left lower quadrant abdominal pain  EXAM: CT ABDOMEN AND PELVIS WITHOUT CONTRAST  TECHNIQUE: Multidetector CT imaging of the abdomen and pelvis was performed following the standard protocol without IV contrast.  COMPARISON:  None.  FINDINGS: Evaluation of this exam is limited in the absence of intravenous contrast.  There are innumerable bilateral pulmonary nodules measuring up to 1.5 cm compatible with metastatic disease. There is focal subsegmental consolidation at the right lung base compatible with atelectasis/pneumonia. Top-normal cardiac size. There is coronary vascular calcification.  No intra-abdominal free air.  Small subhepatic free fluid.  There is the large right suprarenal mass measuring 10 x 14 cm in greatest axial dimension and up to 16 cm in craniocaudal length. This mass demonstrates areas of central necrosis. There is loss of fat plane between this lesion and superior pole of the kidney. There is apparent invasion of the mass into the right lobe of the liver. This mass may arise from the right kidney or right adrenal gland and is most compatible with malignancy. MRI without and with contrast is recommended for further characterization with bold there is a 2.0 x 1.8 cm hypodense lesion in the right lobe of the liver (series 2, image 33) which is not well characterized but may represent metastatic disease. A 1.8 by 1.8 cm hypodense lesion in the right hepatic lobe inferiorly is also not well characterized but may represent cyst. The gallbladder, pancreas, spleen,  appear unremarkable. There is a 1.7 cm left adrenal nodule. The right adrenal gland is not visualized. There is moderate left renal atrophy. There is no hydronephrosis or nephrolithiasis on the left. Stable the left ureter appears unremarkable. There is severe right hydronephrosis with a transition zone in the  right ureteropelvic junction. No calculus identified. The right UPJ stricture is not excluded. There is apparent rotation of the bladder wall, likely related to chronic bladder outlet obstruction. The prostate gland is grossly unremarkable with moderate stool noted throughout the colon. There are scattered colonic diverticula without acute inflammatory changes. There is no evidence of bowel obstruction. The appendix appears unremarkable.  There is aortoiliac atherosclerotic disease. There is no retroperitoneal, para-aortic, aortocaval adenopathy. There is a 11 x 9 cm reason streak mass in the left hemiabdomen in compatible with metastatic disease. There are innumerable omental nodules compatible with metastatic implant. Degenerative changes of the spine. No acute fracture. Check Dr. A 1 cm lucent focus in the left iliac bone lateral to the SI joints (series 2, image 66) is not well characterized but may represent a focal osteopenic area. Lytic metastatic disease is not excluded.  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 This mass may arise from the right kidney, or the right adrenal gland with possible invasion into the liver.  Large soft tissue mass in the left hemiabdomen compatible with metastatic disease. There is extensive omental implants.  Retroperitoneal adenopathy.  Innumerable pulmonary metastatic disease.  No evidence of bowel obstruction.  Severe right hydronephrosis with transition zone at the right UPJ. No stone identified.   Electronically Signed   By: Anner Crete M.D.   On: 12/02/2014 19:02   Ct Biopsy  12/10/2014   CLINICAL DATA:  Abdominal mass  EXAM:  CT-GUIDED BIOPSY OF A LEFT LOWER QUADRANT MASS.  CORE.  MEDICATIONS AND MEDICAL HISTORY: Versed 1 mg, Fentanyl 50 mcg.  Additional Medications: None.  ANESTHESIA/SEDATION: Moderate sedation time: 10 minutes  PROCEDURE: The procedure, risks, benefits, and alternatives were explained to the patient. Questions regarding the procedure were encouraged and answered. The patient understands and consents to the procedure.  The left lower quad was prepped with Betadine in a sterile fashion, and a sterile drape was applied covering the operative field. A sterile gown and sterile gloves were used for the procedure.  Under CT guidance, a(n) 17 gauge guide needle was advanced into the left lower quadrant mass. Subsequently four 18 gauge core biopsies were obtained. The guide needle was removed. Final imaging was performed.  Patient tolerated the procedure well without complication. Vital sign monitoring by nursing staff during the procedure will continue as patient is in the special procedures unit for post procedure observation.  FINDINGS: The images document guide needle placement within the left lower quadrant mass. Post biopsy images demonstrate no hemorrhage.  COMPLICATIONS: None  IMPRESSION: Successful CT-guided core biopsy of a left lower quadrant mass.   Electronically Signed   By: Marybelle Killings M.D.   On: 12/10/2014 12:58   Dg Chest Port 1 View  12/14/2014   CLINICAL DATA:  Shortness of breath with sepsis.  Renal mass  EXAM: PORTABLE CHEST - 1 VIEW  COMPARISON:  Chest radiograph April 04, 2014  FINDINGS: There are nodular lesions throughout the lungs diffusely ranging in size from as small as 3 mm to as large as 1.5 cm. None of these nodular lesions appear cavitated. The heart size and pulmonary vascular normal. No adenopathy apparent. No bone lesions.  IMPRESSION: Diffuse nodular opacities throughout the lungs. Suspect widespread metastatic disease. Septic emboli are possible, although lack of cavitation of any of  these nodular lesions makes widespread metastatic disease more likely.   Electronically Signed   By: Lowella Grip III M.D.   On: 12/14/2014 20:42  Microbiology: Recent Results (from the past 240 hour(s))  Urine culture     Status: None   Collection Time: 12/14/14  8:00 PM  Result Value Ref Range Status   Specimen Description URINE, CATHETERIZED  Final   Special Requests NONE  Final   Culture   Final    MULTIPLE SPECIES PRESENT, SUGGEST RECOLLECTION Performed at [facility]    Report Status 12/16/2014 FINAL  Final  Culture, blood (x 2)     Status: None (Preliminary result)   Collection Time: 12/14/14  8:24 PM  Result Value Ref Range Status   Specimen Description BLOOD RIGHT ARM  Final   Special Requests BOTTLES DRAWN AEROBIC AND ANAEROBIC 10CC AND [place]  Final   Culture   Final    NO GROWTH 2 DAYS Performed at [facility]    Report Status PENDING  Incomplete  MRSA PCR Screening     Status: None   Collection Time: 12/14/14  9:01 PM  Result Value Ref Range Status   MRSA by PCR NEGATIVE NEGATIVE Final    Comment:        The GeneXpert MRSA Assay (FDA approved for NASAL specimens only), is one component of a comprehensive MRSA colonization surveillance program. It is not intended to diagnose MRSA infection nor to guide or monitor treatment for MRSA infections.   Culture, blood (x 2)     Status: None (Preliminary result)   Collection Time: 12/14/14 10:00 PM  Result Value Ref Range Status   Specimen Description BLOOD LEFT HAND  Final   Special Requests BOTTLES DRAWN AEROBIC AND ANAEROBIC 10CC AND [place]  Final   Culture   Final    NO GROWTH 2 DAYS Performed at [facility]    Report Status PENDING  Incomplete     Labs: Basic Metabolic Panel:  Recent Labs Lab 12/14/14 1711 12/15/14 0102 12/16/14 1110  NA 136 134* 135  K 4.4 3.8 3.9  CL 98* 102 100*  CO2 27 25 24   GLUCOSE 123* 132* 131*  BUN 52* 51* 58*  CREATININE 2.75* 2.62* 3.41*   CALCIUM 7.9* 7.0* 7.5*   Liver Function Tests:  Recent Labs Lab 12/14/14 1711  AST 81*  ALT 27  ALKPHOS 294*  BILITOT 1.0  PROT 5.8*  ALBUMIN 2.0*    Recent Labs Lab 12/14/14 1711  LIPASE 17*   No results for input(s): AMMONIA in the last 168 hours. CBC:  Recent Labs Lab 12/14/14 1710 12/15/14 0102 12/16/14 1110  WBC 25.7* 22.9* 19.6*  NEUTROABS 22.9*  --   --   HGB 10.1* 8.8* 8.5*  HCT 31.8* 27.7* 27.1*  MCV 98.5 96.5 98.2  PLT 343 271 304   Cardiac Enzymes: No results for input(s): CKTOTAL, CKMB, CKMBINDEX, TROPONINI in the last 168 hours. BNP: BNP (last 3 results) No results for input(s): BNP in the last 8760 hours.  ProBNP (last 3 results) No results for input(s): PROBNP in the last 8760 hours.  CBG:  Recent Labs Lab 12/17/14 1634 12/17/14 2012 12/18/14 0009 12/18/14 0424 12/18/14 0730  GLUCAP 126* 119* 110* 108* 121*

## 2014-12-18 NOTE — Discharge Instructions (Signed)
Amoxicillin; Clavulanic Acid chewable tablets What is this medicine? AMOXICILLIN; CLAVULANIC ACID (a mox i SIL in; KLAV yoo lan ic AS id) is a penicillin antibiotic. It is used to treat certain kinds of bacterial infections. It It will not work for colds, flu, or other viral infections. This medicine may be used for other purposes; ask your health care provider or pharmacist if you have questions. COMMON BRAND NAME(S): Augmentin What should I tell my health care provider before I take this medicine? They need to know if you have any of these conditions: -bowel disease, like colitis -kidney disease -liver disease -mononucleosis -phenylketonuria -an unusual or allergic reaction to amoxicillin, penicillin, cephalosporin, other antibiotics, clavulanic acid, other medicines, foods, dyes, or preservatives -pregnant or trying to get pregnant -breast-feeding How should I use this medicine? Take this medicine by mouth. Chew it completely before swallowing. Follow the directions on the prescription label. Take this medicine at the start of a meal or snack. Take your medicine at regular intervals. Do not take your medicine more often than directed. Take all of your medicine as directed even if you think you are better. Do not skip doses or stop your medicine early. Talk to your pediatrician regarding the use of this medicine in children. While this drug may be prescribed for selected conditions, precautions do apply. Overdosage: If you think you have taken too much of this medicine contact a poison control center or emergency room at once. NOTE: This medicine is only for you. Do not share this medicine with others. What if I miss a dose? If you miss a dose, take it as soon as you can. If it is almost time for your next dose, take only that dose. Do not take double or extra doses. What may interact with this medicine? -allopurinol -anticoagulants -birth control pills -methotrexate -probenecid This  list may not describe all possible interactions. Give your health care provider a list of all the medicines, herbs, non-prescription drugs, or dietary supplements you use. Also tell them if you smoke, drink alcohol, or use illegal drugs. Some items may interact with your medicine. What should I watch for while using this medicine? Tell your doctor or health care professional if your symptoms do not improve. Do not treat diarrhea with over the counter products. Contact your doctor if you have diarrhea that lasts more than 2 days or if it is severe and watery. If you have diabetes, you may get a false-positive result for sugar in your urine. Check with your doctor or health care professional. Birth control pills may not work properly while you are taking this medicine. Talk to your doctor about using an extra method of birth control. What side effects may I notice from receiving this medicine? Side effects that you should report to your doctor or health care professional as soon as possible: -allergic reactions like skin rash, itching or hives, swelling of the face, lips, or tongue -breathing problems -dark urine -fever or chills, sore throat -redness, blistering, peeling or loosening of the skin, including inside the mouth -seizures -trouble passing urine or change in the amount of urine -unusual bleeding, bruising -unusually weak or tired -white patches or sores in the mouth or throat Side effects that usually do not require medical attention (report to your doctor or health care professional if they continue or are bothersome): -diarrhea -dizziness -headache -nausea, vomiting -stomach upset -vaginal or anal irritation This list may not describe all possible side effects. Call your doctor for medical advice   about side effects. You may report side effects to FDA at 1-800-FDA-1088. Where should I keep my medicine? Keep out of the reach of children. Store at room temperature below 25 degrees  C (77 degrees F). Keep container tightly closed. Throw away any unused medicine after the expiration date. NOTE: This sheet is a summary. It may not cover all possible information. If you have questions about this medicine, talk to your doctor, pharmacist, or health care provider.  2015, [employer]vier/[employer] Standard. (2007-08-02 11:38:22)

## 2014-12-20 LAB — CULTURE, BLOOD (ROUTINE X 2)
CULTURE: NO GROWTH
Culture: NO GROWTH

## 2014-12-22 ENCOUNTER — Telehealth: Payer: Self-pay | Admitting: *Deleted

## 2014-12-22 NOTE — Telephone Encounter (Signed)
PT.'S WIFE HAS SEEN THE PATH REPORT FROM PT.'S BIOPSY DONE ON 12/10/14 ON HER HUSBAND'S MY CHART. SHE WOULD LIKE DR.SHADAD TO EMAIL HER AN EXPLANATION OF PT.'S BIOPSY PARTICULARLY THE LOCATION OF THE PRIMARY SITE OF PT.'S CANCER. INFORMED PT.'S WIFE THAT DR.SHADAD IS OUT OF THE OFFICE TODAY AND WILL RETURN TOMORROW. SHE VOICES UNDERSTANDING.

## 2014-12-23 NOTE — Telephone Encounter (Signed)
The results of the pathology was discussed with the patien's wife; Darren Brennan. Her questions were answered today to her satisfaction.

## 2015-01-22 DEATH — deceased

## 2015-10-09 IMAGING — CT CT ABD-PELV W/O CM
2 of 4 series · 16 of 46 positions shown, 18 images · non-contrast
Comparison: 12/10/2014, 12/02/2014

CLINICAL DATA: Generalized abdominal pain, status post renal biopsy
5 days ago

EXAM:
CT ABDOMEN AND PELVIS WITHOUT CONTRAST
TECHNIQUE: Multidetector CT imaging of the abdomen and pelvis was performed
following the standard protocol without IV contrast.

[Series 2: abd/pel w/o · axial · non-contrast · 0.73mm/px · z∈[-482,-2]mm · 13 of 106 slices shown, 15 images]
[im 5/106  soft-tissue]
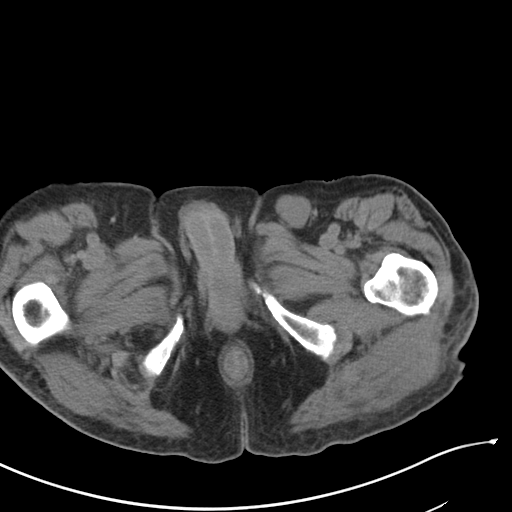
[im 5/106  bone]
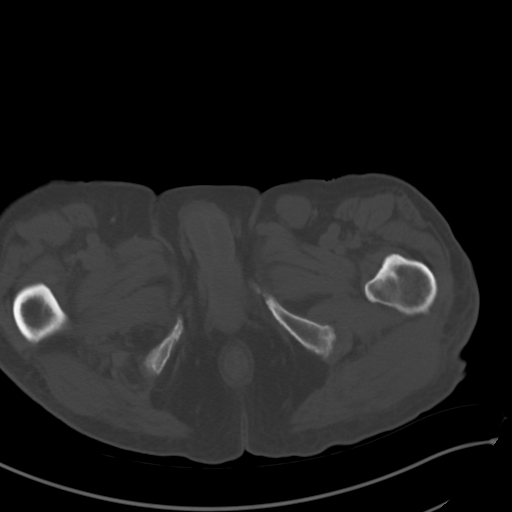
[im 14/106  soft-tissue]
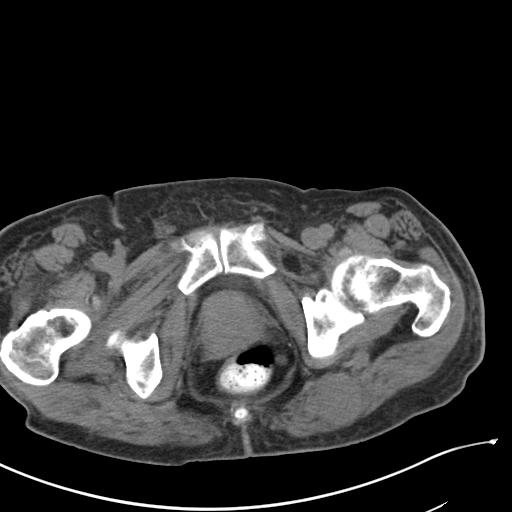
[im 23/106  soft-tissue]
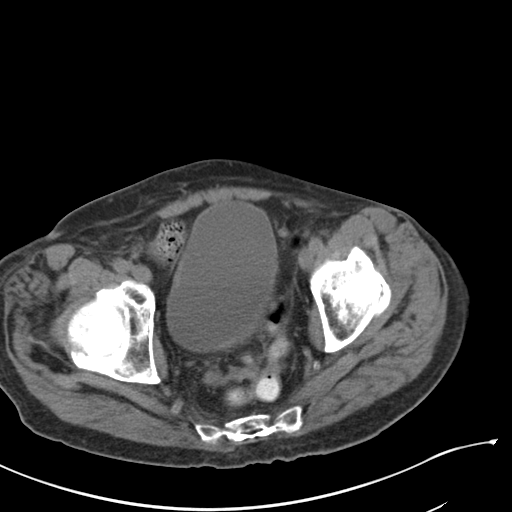
[im 28/106  soft-tissue]
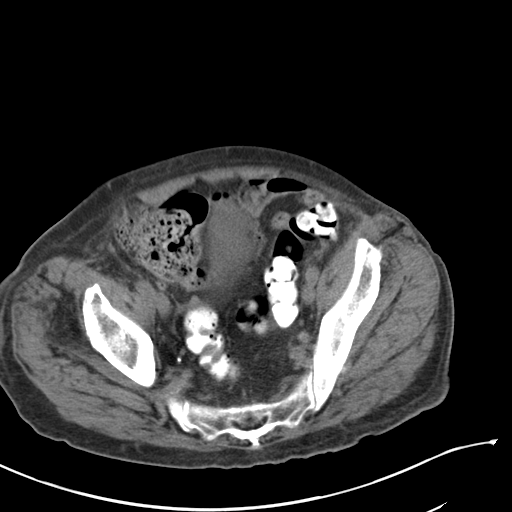
[im 37/106  soft-tissue]
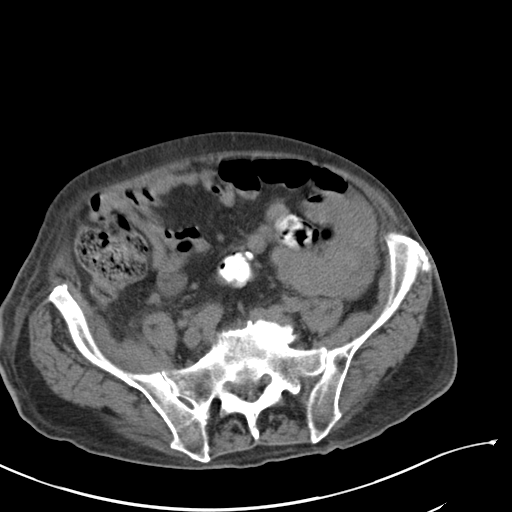
[im 46/106  soft-tissue]
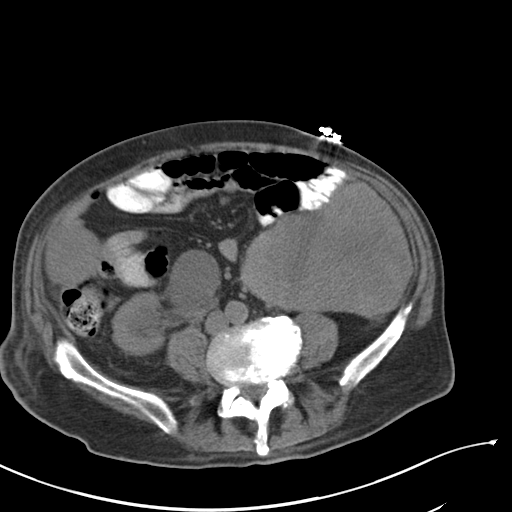
[im 55/106  soft-tissue]
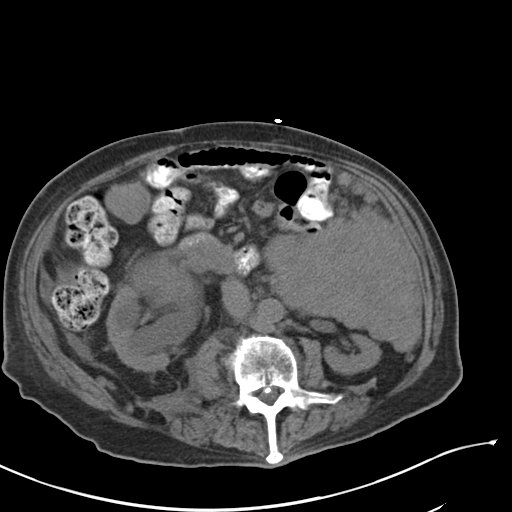
[im 60/106  soft-tissue]
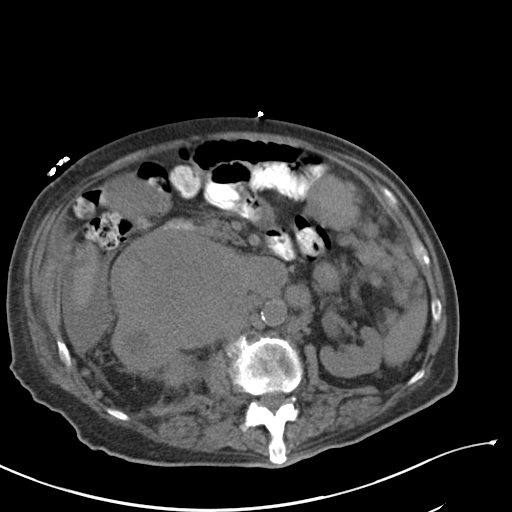
[im 69/106  soft-tissue]
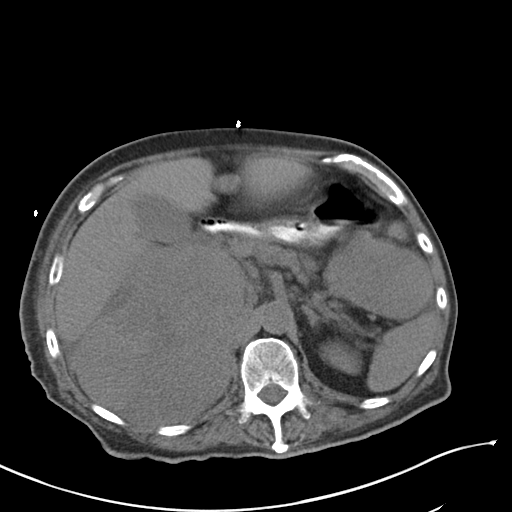
[im 69/106  bone]
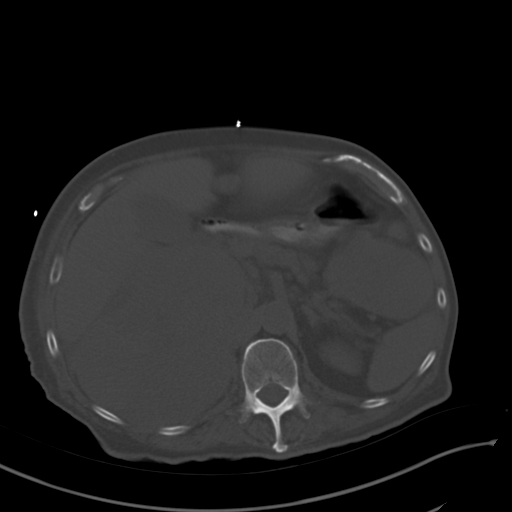
[im 78/106  soft-tissue]
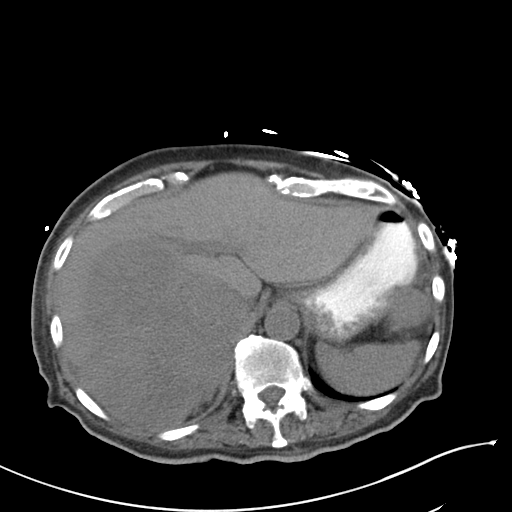
[im 83/106  soft-tissue]
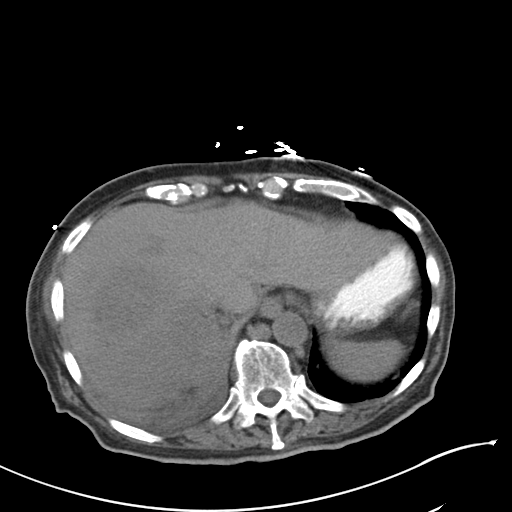
[im 92/106  soft-tissue]
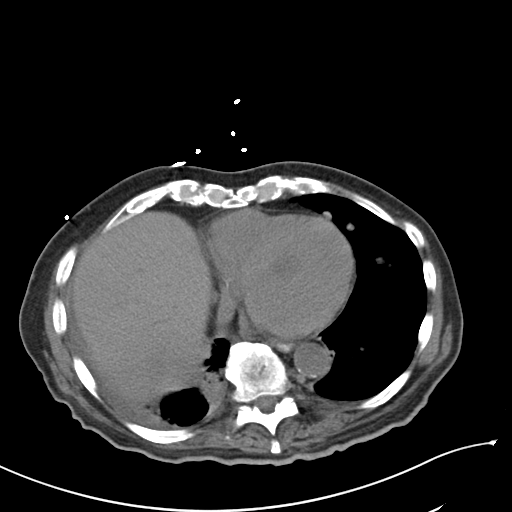
[im 101/106  soft-tissue]
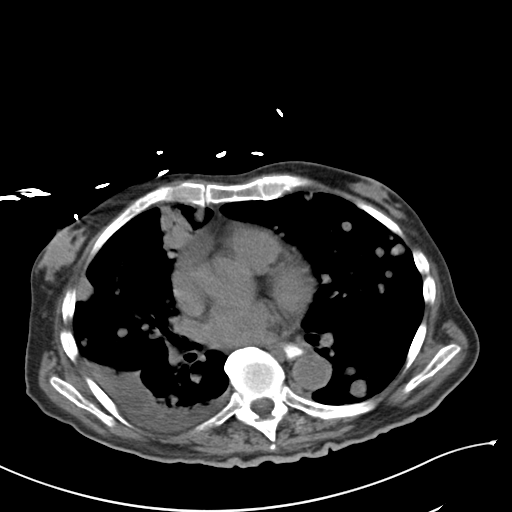

[Series 4: coronal · coronal · 0.74mm/px · 3 of 95 slices shown]
[im 32/95  soft-tissue]
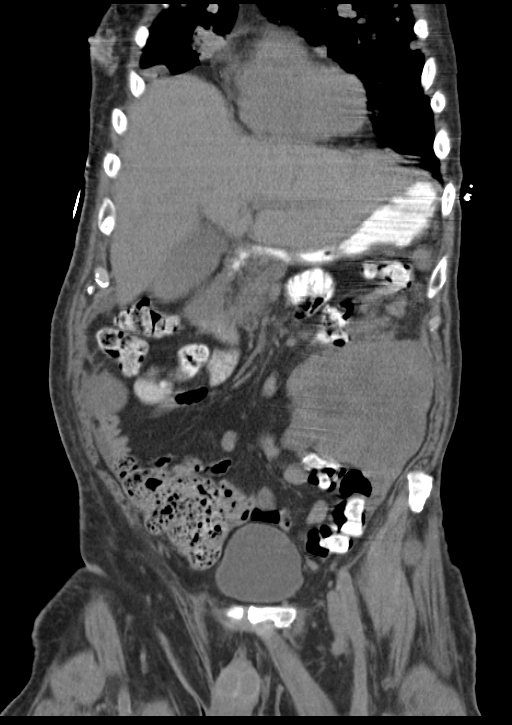
[im 42/95  soft-tissue]
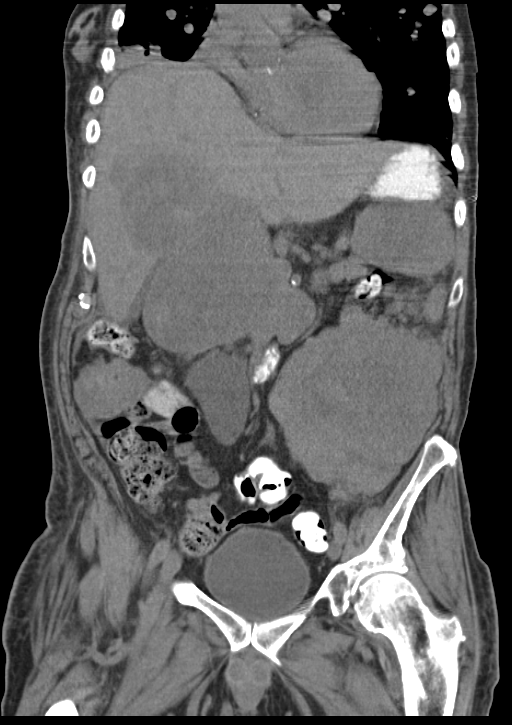
[im 53/95  soft-tissue]
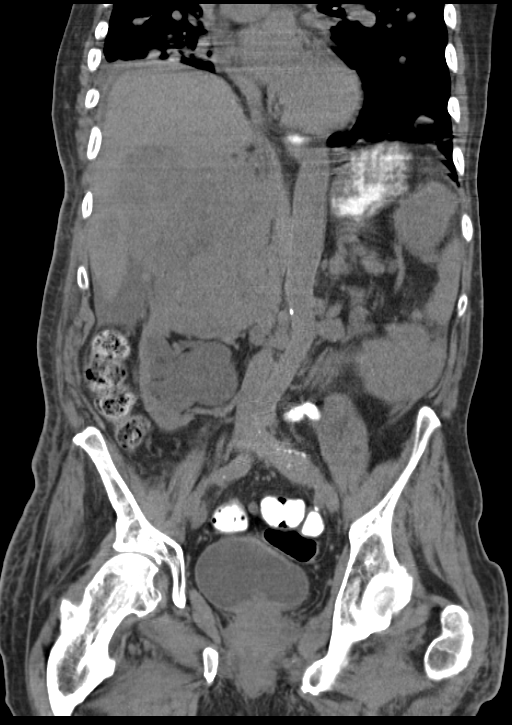

[16 of 46 positions shown; findings below may reference images not displayed]

FINDINGS: Innumerable round bilateral lung masses unchanged. Increased
consolidation right lung base. New small right pleural effusion.

Large right abdominal mass in the vicinity of the right kidney right
adrenal gland and right lobe of the liver which may arise from any
of the structures with involvement or invasion of the others. 2.5 cm
mass adjacent to this in the inferior right lobe of the liver also
stable. Gallbladder within normal limits. Dilated right renal pelvis
unchanged.

Pancreas normal except for displacement by mass effect related to
the mass. Spleen normal. Left adrenal gland nodularity stable. Large
mass posterior and inferior to the stomach measures up to 7.5 cm,
unchanged. Numerous enlarged peritoneal lymph nodes/implants stable.
Right flank peritoneal implant measures 5.5 cm. Just to the right of
midline is another peritoneal implant measuring 3.8 cm. In the left
flank there is a large soft tissue mass measuring 13 x 12 cm. Stable
retroperitoneal periaortic adenopathy.

Small volume ascites inferior to the liver similar to prior study.

Mild enlargement of the prostate. Bladder is normal. No acute
abnormality involving bowel or bladder.
IMPRESSION: Large right flank mass without significant interval change
consistent with malignancy. Hepatic mass, lung masses, and
peritoneal implants with left adrenal nodularity and enlarged
abdominal lymph nodes all without significant interval change.

Right hydronephrosis with transition at the ureteropelvic junction
unchanged.
